# Patient Record
Sex: Female | Born: 2005 | Race: White | Hispanic: No | Marital: Single | Smoking: Former smoker
Health system: Southern US, Community
[De-identification: ages and names within clinical notes are randomized; demographics above are authoritative.]

## PROBLEM LIST (undated history)

## (undated) DIAGNOSIS — Z87440 Personal history of urinary (tract) infections: Secondary | ICD-10-CM

## (undated) DIAGNOSIS — F329 Major depressive disorder, single episode, unspecified: Secondary | ICD-10-CM

## (undated) DIAGNOSIS — K5909 Other constipation: Secondary | ICD-10-CM

## (undated) DIAGNOSIS — F419 Anxiety disorder, unspecified: Secondary | ICD-10-CM

## (undated) DIAGNOSIS — F32A Depression, unspecified: Secondary | ICD-10-CM

## (undated) DIAGNOSIS — R109 Unspecified abdominal pain: Secondary | ICD-10-CM

## (undated) HISTORY — PX: NO PAST SURGERIES: SHX2092

## (undated) HISTORY — DX: Unspecified abdominal pain: R10.9

## (undated) HISTORY — DX: Personal history of urinary (tract) infections: Z87.440

## (undated) HISTORY — DX: Other constipation: K59.09

---
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
## 2023-01-29 ENCOUNTER — Other Ambulatory Visit (HOSPITAL_BASED_OUTPATIENT_CLINIC_OR_DEPARTMENT_OTHER): Payer: Self-pay

## 2023-01-29 ENCOUNTER — Encounter (HOSPITAL_BASED_OUTPATIENT_CLINIC_OR_DEPARTMENT_OTHER): Payer: Self-pay

## 2023-01-29 ENCOUNTER — Other Ambulatory Visit: Payer: Self-pay

## 2023-01-29 ENCOUNTER — Emergency Department (HOSPITAL_BASED_OUTPATIENT_CLINIC_OR_DEPARTMENT_OTHER)
Admission: EM | Admit: 2023-01-29 | Discharge: 2023-01-29 | Disposition: A | Discharge status: Home / Self Care | Payer: Medicaid Other | Attending: Emergency Medicine | Admitting: Emergency Medicine

## 2023-01-29 DIAGNOSIS — T485X5A Adverse effect of other anti-common-cold drugs, initial encounter: Secondary | ICD-10-CM | POA: Insufficient documentation

## 2023-01-29 DIAGNOSIS — R0981 Nasal congestion: Secondary | ICD-10-CM | POA: Diagnosis present

## 2023-01-29 DIAGNOSIS — J3489 Other specified disorders of nose and nasal sinuses: Secondary | ICD-10-CM | POA: Diagnosis not present

## 2023-01-29 NOTE — ED Triage Notes (Signed)
In for eval of nasal congestion onset couple of months ago. Used Afrin. PCP administered steroid shot and antibiotic shot approx 3 weeks ago. Then prescribed prednisone PO by PCP 2 weeks ago. No relief. Reports Left eye itching. Denies cough, shortness of breath.

## 2023-01-29 NOTE — Discharge Instructions (Addendum)
As we discussed, I strongly suspect that your symptoms are due to long-term routine Afrin use.  Please discontinue this medication completely.  Please be aware, it can take some time for symptoms to resolve after discontinuing the medication.  I recommend continuing with daily Flonase use.  You can also add Zyrtec which you can get over-the-counter for additional benefit.  I have also given you a referral to an ear nose and throat doctor for you to follow-up with.  Please call them and schedule an appointment.  Please be sure that you have stopped the Afrin for several days before you have a follow-up appointment.  Return if development of any new or worsening symptoms.

## 2023-01-29 NOTE — ED Provider Notes (Signed)
[facility] Provider Note   CSN: [id number] Arrival date & time: 01/29/23  1129     History  Chief Complaint  Patient presents with   Nasal Congestion    Tiffany Hansen is a 17 y.o. female.  Patient with noncontributory past medical history presents today with complaints of nasal congestion. She states that same began about 2 months ago and has been persistent since. She has been using Afrin spray daily as well as Flonase and saline nasal mist. She went to her doctor and has been given prednisone and antibiotics which she has taken without any improvement. She has also been referred to ENT but doesn't have an appointment until March 27. She is unable to breathe out of either nare. Denies sinus pressure, cough, fevers, or chills. Her nasal drainage is clear. Symptoms have been unchanged since onset.  The history is provided by the patient. No language interpreter was used.       Home Medications Prior to Admission medications   Medication Sig Start Date End Date Taking? Authorizing Provider  norelgestromin-ethinyl estradiol Marilu Favre) 150-35 MCG/24HR transdermal patch Place 1 patch onto the skin once a week. 08/11/22   Starr Lake, CNM  acetaminophen (TYLENOL) 325 MG tablet Take 2 tablets (650 mg total) by mouth every 4 (four) hours as needed (for pain scale < 4). Patient not taking: Reported on 08/02/2022 12/14/21   Precious Gilding, DO  ibuprofen (ADVIL) 600 MG tablet Take 1 tablet (600 mg total) by mouth every 6 (six) hours. Patient not taking: Reported on 08/02/2022 12/14/21   Precious Gilding, DO      Allergies    Amoxicillin    Review of Systems   Review of Systems  HENT:  Positive for congestion.   All other systems reviewed and are negative.   Physical Exam Updated Vital Signs BP 117/69 (BP Location: Left Arm)   Pulse 93   Temp 97.9 F (36.6 C) (Oral)   Resp 20   Ht '5\' 2"'$  (1.575 m)   Wt 60.8 kg   SpO2 100%   BMI 24.51  kg/m  Physical Exam Vitals and nursing note reviewed.  Constitutional:      General: She is not in acute distress.    Appearance: Normal appearance. She is normal weight. She is not ill-appearing, toxic-appearing or diaphoretic.  HENT:     Head: Normocephalic and atraumatic.     Comments: No frontal or maxillary sinus tenderness to palpation    Nose: Congestion and rhinorrhea present.     Comments: Bilateral nares with congestion and clear nasal drainage.     Mouth/Throat:     Mouth: Mucous membranes are moist.     Pharynx: Oropharynx is clear. No oropharyngeal exudate or posterior oropharyngeal erythema.  Eyes:     Extraocular Movements: Extraocular movements intact.     Conjunctiva/sclera: Conjunctivae normal.     Pupils: Pupils are equal, round, and reactive to light.  Cardiovascular:     Rate and Rhythm: Normal rate.  Pulmonary:     Effort: Pulmonary effort is normal. No respiratory distress.  Musculoskeletal:        General: Normal range of motion.     Cervical back: Normal range of motion and neck supple.  Skin:    General: Skin is warm and dry.  Neurological:     General: No focal deficit present.     Mental Status: She is alert.  Psychiatric:        Mood  and Affect: Mood normal.        Behavior: Behavior normal.     ED Results / Procedures / Treatments   Labs (all labs ordered are listed, but only abnormal results are displayed) Labs Reviewed - No data to display  EKG None  Radiology No results found.  Procedures Procedures    Medications Ordered in ED Medications - No data to display  ED Course/ Medical Decision Making/ A&P                             Medical Decision Making  Patient presents today with complaints of 2 months of nasal congestion.  She is afebrile, nontoxic-appearing, and in no acute distress with reassuring vital signs.  Physical exam reveals nasal congestion with clear rhinorrhea.  No cough, sore throat, fevers, chills, shortness  of breath, or chest pain.  No maxillary or frontal sinus tenderness to palpation.  Symptoms not consistent with a sinus infection, she has already tried steroids and antibiotics without improvement.  Symptoms seem consistent with rebound congestion from daily Afrin use.  She does state that she has used Afrin spray almost every day for the last 2 months. Recommend discontinuing this medication and continuing daily Flonase use and adding Zyrtec as well. Will give referral to ENT per patients request. Evaluation and diagnostic testing in the emergency department does not suggest an emergent condition requiring admission or immediate intervention beyond what has been performed at this time.  Plan for discharge with close PCP follow-up.  Patient is understanding and amenable with plan, educated on red flag symptoms that would prompt immediate return.  Patient discharged in stable condition.   Final Clinical Impression(s) / ED Diagnoses Final diagnoses:  Nasal congestion due to prolonged use of decongestants    Rx / DC Orders ED Discharge Orders     None     An After Visit Summary was printed and given to the patient.     Nestor Lewandowsky 01/29/23 1231    Pattricia Boss, MD 01/30/23 [phone]

## 2023-02-18 ENCOUNTER — Other Ambulatory Visit: Payer: Self-pay | Admitting: *Deleted

## 2023-02-18 DIAGNOSIS — Z789 Other specified health status: Secondary | ICD-10-CM

## 2023-02-19 ENCOUNTER — Encounter: Payer: Self-pay | Admitting: Obstetrics & Gynecology

## 2023-02-19 ENCOUNTER — Telehealth: Payer: Self-pay

## 2023-02-19 LAB — BETA HCG QUANT (REF LAB): hCG Quant: 190 m[IU]/mL

## 2023-02-19 NOTE — Telephone Encounter (Signed)
Patient would like for someone to call her about her test results 

## 2023-02-19 NOTE — Telephone Encounter (Signed)
Spoke to patient via mychart.

## 2023-02-20 ENCOUNTER — Other Ambulatory Visit: Payer: Self-pay | Admitting: Obstetrics & Gynecology

## 2023-02-20 DIAGNOSIS — O3680X0 Pregnancy with inconclusive fetal viability, not applicable or unspecified: Secondary | ICD-10-CM

## 2023-02-20 NOTE — Progress Notes (Signed)
US order

## 2023-02-21 ENCOUNTER — Encounter: Payer: Self-pay | Admitting: Obstetrics & Gynecology

## 2023-02-25 ENCOUNTER — Encounter: Payer: Self-pay | Admitting: Obstetrics & Gynecology

## 2023-02-27 ENCOUNTER — Encounter: Payer: Self-pay | Admitting: Obstetrics & Gynecology

## 2023-02-28 ENCOUNTER — Encounter: Payer: Self-pay | Admitting: Obstetrics & Gynecology

## 2023-03-01 ENCOUNTER — Inpatient Hospital Stay (HOSPITAL_COMMUNITY)
Admission: AD | Admit: 2023-03-01 | Discharge: 2023-03-01 | Disposition: A | Discharge status: Home / Self Care | Payer: Medicaid Other | Attending: Obstetrics and Gynecology | Admitting: Obstetrics and Gynecology

## 2023-03-01 ENCOUNTER — Inpatient Hospital Stay (HOSPITAL_COMMUNITY): Payer: Medicaid Other

## 2023-03-01 ENCOUNTER — Encounter (HOSPITAL_COMMUNITY): Payer: Self-pay

## 2023-03-01 DIAGNOSIS — Z3490 Encounter for supervision of normal pregnancy, unspecified, unspecified trimester: Secondary | ICD-10-CM

## 2023-03-01 DIAGNOSIS — R1084 Generalized abdominal pain: Secondary | ICD-10-CM | POA: Diagnosis present

## 2023-03-01 DIAGNOSIS — R9431 Abnormal electrocardiogram [ECG] [EKG]: Secondary | ICD-10-CM | POA: Insufficient documentation

## 2023-03-01 DIAGNOSIS — R079 Chest pain, unspecified: Secondary | ICD-10-CM

## 2023-03-01 DIAGNOSIS — R0602 Shortness of breath: Secondary | ICD-10-CM | POA: Diagnosis not present

## 2023-03-01 DIAGNOSIS — O99891 Other specified diseases and conditions complicating pregnancy: Secondary | ICD-10-CM | POA: Diagnosis not present

## 2023-03-01 DIAGNOSIS — Z87891 Personal history of nicotine dependence: Secondary | ICD-10-CM | POA: Insufficient documentation

## 2023-03-01 DIAGNOSIS — Z3A01 Less than 8 weeks gestation of pregnancy: Secondary | ICD-10-CM | POA: Diagnosis not present

## 2023-03-01 DIAGNOSIS — Z3A00 Weeks of gestation of pregnancy not specified: Secondary | ICD-10-CM | POA: Diagnosis not present

## 2023-03-01 DIAGNOSIS — O26891 Other specified pregnancy related conditions, first trimester: Secondary | ICD-10-CM | POA: Diagnosis not present

## 2023-03-01 DIAGNOSIS — F419 Anxiety disorder, unspecified: Secondary | ICD-10-CM

## 2023-03-01 DIAGNOSIS — R0789 Other chest pain: Secondary | ICD-10-CM | POA: Insufficient documentation

## 2023-03-01 LAB — URINALYSIS, ROUTINE W REFLEX MICROSCOPIC
Bilirubin Urine: NEGATIVE
Glucose, UA: NEGATIVE mg/dL
Hgb urine dipstick: NEGATIVE
Ketones, ur: 20 mg/dL — AB
Leukocytes,Ua: NEGATIVE
Nitrite: NEGATIVE
Protein, ur: NEGATIVE mg/dL
Specific Gravity, Urine: 1.024 (ref 1.005–1.030)
pH: 6 (ref 5.0–8.0)

## 2023-03-01 LAB — CBC
HCT: 37.5 % (ref 36.0–46.0)
Hemoglobin: 12.6 g/dL (ref 12.0–15.0)
MCH: 28.5 pg (ref 26.0–34.0)
MCHC: 33.6 g/dL (ref 30.0–36.0)
MCV: 84.8 fL (ref 80.0–100.0)
Platelets: 287 10*3/uL (ref 150–400)
RBC: 4.42 MIL/uL (ref 3.87–5.11)
RDW: 13.2 % (ref 11.5–15.5)
WBC: 6.3 10*3/uL (ref 4.0–10.5)
nRBC: 0 % (ref 0.0–0.2)

## 2023-03-01 LAB — WET PREP, GENITAL
Clue Cells Wet Prep HPF POC: NONE SEEN
Trich, Wet Prep: NONE SEEN
WBC, Wet Prep HPF POC: <10 (ref <10)
Yeast Wet Prep HPF POC: NONE SEEN

## 2023-03-01 LAB — HCG, QUANTITATIVE, PREGNANCY: hCG, Beta Chain, Quant, S: 10711 m[IU]/mL — ABNORMAL HIGH (ref <5)

## 2023-03-01 NOTE — MAU Note (Signed)
...  Tiffany Hansen is a 17 y.o. at Unknown here in MAU reporting: Chest pain, SOB, and abdominal pain. She reports she was cleaning her home and began to feel abdominal cramps and felt like her chest was getting tight. She reports her abdominal pain is "all throughout." She reports she wants to ensure everything was okay. She also reports at times it feels hard to breathe. She reports the shortness of breath comes randomly. Denies VB or concerns for STD's.   [name], CNM, in triage. EKG performed by NT.   LMP: 01/21/2023 Pain score: Denies current pain.  Lab orders placed from triage:  UA

## 2023-03-01 NOTE — MAU Note (Signed)
Called Main Lab requesting blood work to be ran as it was sent an hour ago. Kim in [employer] stated she has the samples now and will run them.

## 2023-03-01 NOTE — MAU Note (Signed)
Called lab to request urine to be ran.

## 2023-03-01 NOTE — Discharge Instructions (Signed)

## 2023-03-01 NOTE — MAU Provider Note (Signed)
History     CSN: [id number]  Arrival date and time: 03/01/23 1522   Event Date/Time   First Provider Initiated Contact with Patient 03/01/23 1710      Chief Complaint  Patient presents with   Abdominal Pain   Shortness of Breath   Chest Pain   HPI Tiffany Hansen is a 17 y.o. [id number] at [redacted]w[redacted]d by LMP who presents to MAU with chief complaint of generalized abdominal pain. This is a new complaint, onset today while cleaning her house. Pain is crampy and not present on arrival to MAU or during encounter.  Patient also reports shortness of breath and chest pain.  These are recurrent problems, current episodes occurred in conjunction with onset of abdominal pain. Patient endorses history of evaluation by Cardiology and states they were never able to find a cause or definitive diagnosis. She denies activity intolerance, weakness and syncope. Pain waxes and wanes without any associated temporal a factors. Patient states there are no consistent triggers.   Patient also reports hx of anxiety. She does not feel that her SOB and chest pain are related to her anxiety.  OB History     Gravida  3   Para  1   Term  0   Preterm  1   AB  1   Living  1      SAB  1   IAB  0   Ectopic  0   Multiple  0   Live Births  1           Past Medical History:  Diagnosis Date   Abdominal pain, recurrent    Anxiety    Constipation, chronic    Depression     Past Surgical History:  Procedure Laterality Date   NO PAST SURGERIES      Family History  Problem Relation Age of Onset   Celiac disease Neg Hx    Ulcers Neg Hx    Cholelithiasis Neg Hx     Social History   Tobacco Use   Smoking status: Former    Types: Cigarettes    Quit date: 03/2021    Years since quitting: 1.9   Smokeless tobacco: Never  Vaping Use   Vaping Use: Former   Substances: Nicotine  Substance Use Topics   Alcohol use: No   Drug use: No    Allergies:  Allergies  Allergen Reactions   Amoxicillin  Itching    Medications Prior to Admission  Medication Sig Dispense Refill Last Dose   norelgestromin-ethinyl estradiol Burr Medico) 150-35 MCG/24HR transdermal patch Place 1 patch onto the skin once a week. 3 patch 12    acetaminophen (TYLENOL) 325 MG tablet Take 2 tablets (650 mg total) by mouth every 4 (four) hours as needed (for pain scale < 4). (Patient not taking: Reported on 08/02/2022)      ibuprofen (ADVIL) 600 MG tablet Take 1 tablet (600 mg total) by mouth every 6 (six) hours. (Patient not taking: Reported on 08/02/2022) 30 tablet 0     Review of Systems  Respiratory:  Positive for chest tightness and shortness of breath.   Gastrointestinal:  Positive for abdominal pain.  All other systems reviewed and are negative.  Physical Exam   Blood pressure (!) 125/49, pulse 98, temperature 98.9 F (37.2 C), temperature source Oral, resp. rate 20, height  (1.575 m), weight 63.8 kg, last menstrual period 01/21/2023, SpO2 100 %, not currently breastfeeding.  Physical Exam Vitals and nursing note reviewed. Exam conducted with  a chaperone present.  Constitutional:      Appearance: She is well-developed.  Cardiovascular:     Rate and Rhythm: Normal rate and regular rhythm.     Heart sounds: Normal heart sounds.  Pulmonary:     Effort: Pulmonary effort is normal.     Breath sounds: Normal breath sounds.  Abdominal:     General: Abdomen is flat.     Tenderness: There is no abdominal tenderness.  Skin:    Capillary Refill: Capillary refill takes less than 2 seconds.  Neurological:     Mental Status: She is alert and oriented to person, place, and time.  Psychiatric:        Mood and Affect: Mood normal.        Behavior: Behavior normal.   Patient speaking in full sentences with dyspnea. No overt signs of air hunger  MAU Course  Procedures  MDM  Orders Placed This Encounter  Procedures   Wet prep, genital   US OB LESS THAN 14 WEEKS WITH OB TRANSVAGINAL   Urinalysis, Routine w  reflex microscopic -Urine, Clean Catch   CBC   hCG, quantitative, pregnancy   EKG 12-Lead   ED EKG   Discharge patient   Patient Vitals for the past 24 hrs:  BP Temp Temp src Pulse Resp SpO2 Height Weight  03/01/23 1809 (!) 125/49 -- -- 98 -- -- -- --  03/01/23 1614 (!) 112/53 98.9 F (37.2 C) Oral 87 20 100 % -- --  03/01/23 1612 -- -- -- -- -- --  (1.575 m) 63.8 kg  ' Results for orders placed or performed during the hospital encounter of 03/01/23 (from the past 24 hour(s))  CBC     Status: None   Collection Time: 03/01/23  4:30 PM  Result Value Ref Range   WBC 6.3 4.0 - 10.5 K/uL   RBC 4.42 3.87 - 5.11 MIL/uL   Hemoglobin 12.6 12.0 - 15.0 g/dL   HCT 65.7 84.6 - 96.2 %   MCV 84.8 80.0 - 100.0 fL   MCH 28.5 26.0 - 34.0 pg   MCHC 33.6 30.0 - 36.0 g/dL   RDW 95.2 84.1 - 32.4 %   Platelets 287 150 - 400 K/uL   nRBC 0.0 0.0 - 0.2 %  Wet prep, genital     Status: None   Collection Time: 03/01/23  4:42 PM  Result Value Ref Range   Yeast Wet Prep HPF POC NONE SEEN NONE SEEN   Trich, Wet Prep NONE SEEN NONE SEEN   Clue Cells Wet Prep HPF POC NONE SEEN NONE SEEN   WBC, Wet Prep HPF POC <10 <10   Sperm PRESENT    US OB LESS THAN 14 WEEKS WITH OB TRANSVAGINAL  Result Date: 03/01/2023 CLINICAL DATA:  Abdominal pain in pregnancy EXAM: OBSTETRIC <14 WK ULTRASOUND TECHNIQUE: Transabdominal ultrasound was performed for evaluation of the gestation as well as the maternal uterus and adnexal regions. COMPARISON:  None Available. FINDINGS: Intrauterine gestational sac: Single Yolk sac:  Visualized. Embryo:  Not Visualized. Cardiac Activity: Not Visualized. Heart Rate: NA MSD:  8.4 mm   5 w   4 d CRL: N/A Subchorionic hemorrhage:  None visualized. Maternal uterus/adnexae: Trace pelvic free fluid.  Normal ovaries. IMPRESSION: Probable early intrauterine gestational sac and yolk sac, but no fetal pole or cardiac activity yet visualized. Recommend follow-up quantitative B-HCG levels and  follow-up US in 14 days to assess viability. This recommendation follows SRU consensus guidelines: Diagnostic Criteria for  Nonviable Pregnancy Early in the First Trimester. Malva Limes Med 2013; 268:3419-62. Electronically Signed   By: Agustin Cree M.D.   On: 03/01/2023 17:01   Assessment and Plan  --17 y.o. [id number] with confirmed IUP (+GS, +YS) --Grossly normal EKG reviewed with Dr. Berton Lan --No acute findings on exam or labs collected in MAU --Discharge home in stable condition with first trimester precautions  F/U: --Patient has scheduled prenatal care with [facility]  Calvert Cantor, MSA, MSN, CNM 03/01/2023, 7:33 PM

## 2023-03-05 LAB — GC/CHLAMYDIA PROBE AMP (~~LOC~~) NOT AT ARMC
Chlamydia: NEGATIVE
Comment: NEGATIVE
Comment: NORMAL
Neisseria Gonorrhea: NEGATIVE

## 2023-03-06 ENCOUNTER — Encounter: Payer: Self-pay | Admitting: Adult Health

## 2023-03-06 ENCOUNTER — Ambulatory Visit (INDEPENDENT_AMBULATORY_CARE_PROVIDER_SITE_OTHER): Payer: Medicaid Other | Admitting: Adult Health

## 2023-03-06 VITALS — Ht 62.0 in | Wt 145.0 lb

## 2023-03-06 DIAGNOSIS — F419 Anxiety disorder, unspecified: Secondary | ICD-10-CM | POA: Diagnosis not present

## 2023-03-06 DIAGNOSIS — Z3A01 Less than 8 weeks gestation of pregnancy: Secondary | ICD-10-CM

## 2023-03-06 DIAGNOSIS — F418 Other specified anxiety disorders: Secondary | ICD-10-CM | POA: Insufficient documentation

## 2023-03-06 DIAGNOSIS — O99341 Other mental disorders complicating pregnancy, first trimester: Secondary | ICD-10-CM | POA: Diagnosis not present

## 2023-03-06 MED ORDER — SERTRALINE HCL 25 MG PO TABS: 25.0000 mg | ORAL_TABLET | Freq: Every day | ORAL | 3 refills | Status: DC

## 2023-03-06 MED ORDER — HYDROXYZINE HCL 10 MG PO TABS: 10.0000 mg | ORAL_TABLET | Freq: Three times a day (TID) | ORAL | 1 refills | Status: DC | PRN

## 2023-03-06 NOTE — Progress Notes (Signed)
Patient ID: Tiffany Hansen, female   DOB: 03-Aug-2006, 17 y.o.   MRN: [id number]   TELEHEALTH GYNECOLOGY VISIT ENCOUNTER NOTE  Provider location: [facility]   Patient location: Home  I connected with Lauralye Creely on 03/06/23 at 11:50 AM EDT by telephone and verified that I am speaking with the correct person using two identifiers. Patient was unable to do MyChart audiovisual encounter due to technical difficulties, she tried several times.    I discussed the limitations, risks, security and privacy concerns of performing an evaluation and management service by telephone and the availability of in person appointments. I also discussed with the patient that there may be a patient responsible charge related to this service. The patient expressed understanding and agreed to proceed.   History:  Tiffany Hansen is a 17 y.o. [phone] female being evaluated today for anxiety. She denies any other concerns.      She says she worries and does not want to be alone, has had anxiety in the past, but does not remember medication she took. She is about [redacted] weeks pregnant by LMP.  Past Medical History:  Diagnosis Date   Abdominal pain, recurrent    Anxiety    Constipation, chronic    Depression    Past Surgical History:  Procedure Laterality Date   NO PAST SURGERIES     The following portions of the patient's history were reviewed and updated as appropriate: allergies, current medications, past family history, past medical history, past social history, past surgical history and problem list.   Health Maintenance:  pap at 19  Review of Systems:  Pertinent items noted in HPI and remainder of comprehensive ROS otherwise negative.  Physical Exam:   General:  Alert, oriented and cooperative.   Mental Status: Normal mood and affect perceived. Normal judgment and thought content.  Physical exam deferred due to nature of the encounter Ht  (1.575 m)   Wt 145 lb (65.8 kg)    LMP 01/21/2023   Breastfeeding No   BMI 26.52 kg/m   Labs and Imaging Results for orders placed or performed during the hospital encounter of 03/01/23 (from the past 336 hour(s))  GC/Chlamydia probe amp ([place])not at [facility]   Collection Time: 03/01/23  4:20 PM  Result Value Ref Range   Neisseria Gonorrhea Negative    Chlamydia Negative    Comment Normal Reference Ranger Chlamydia - Negative    Comment      Normal Reference Range Neisseria Gonorrhea - Negative  CBC   Collection Time: 03/01/23  4:30 PM  Result Value Ref Range   WBC 6.3 4.0 - 10.5 K/uL   RBC 4.42 3.87 - 5.11 MIL/uL   Hemoglobin 12.6 12.0 - 15.0 g/dL   HCT 14.7 82.9 - 56.2 %   MCV 84.8 80.0 - 100.0 fL   MCH 28.5 26.0 - 34.0 pg   MCHC 33.6 30.0 - 36.0 g/dL   RDW 13.0 86.5 - 78.4 %   Platelets 287 150 - 400 K/uL   nRBC 0.0 0.0 - 0.2 %  hCG, quantitative, pregnancy   Collection Time: 03/01/23  4:30 PM  Result Value Ref Range   hCG, Beta Chain, Quant, S 10,711 (H) <5 mIU/mL  Wet prep, genital   Collection Time: 03/01/23  4:42 PM  Result Value Ref Range   Yeast Wet Prep HPF POC NONE SEEN NONE SEEN   Trich, Wet Prep NONE SEEN NONE SEEN   Clue Cells Wet Prep HPF  POC NONE SEEN NONE SEEN   WBC, Wet Prep HPF POC <10 <10   Sperm PRESENT   Urinalysis, Routine w reflex microscopic -Urine, Clean Catch   Collection Time: 03/01/23  6:12 PM  Result Value Ref Range   Color, Urine YELLOW YELLOW   APPearance CLEAR CLEAR   Specific Gravity, Urine 1.024 1.005 - 1.030   pH 6.0 5.0 - 8.0   Glucose, UA NEGATIVE NEGATIVE mg/dL   Hgb urine dipstick NEGATIVE NEGATIVE   Bilirubin Urine NEGATIVE NEGATIVE   Ketones, ur 20 (A) NEGATIVE mg/dL   Protein, ur NEGATIVE NEGATIVE mg/dL   Nitrite NEGATIVE NEGATIVE   Leukocytes,Ua NEGATIVE NEGATIVE   US OB LESS THAN 14 WEEKS WITH OB TRANSVAGINAL  Result Date: 03/01/2023 CLINICAL DATA:  Abdominal pain in pregnancy EXAM: OBSTETRIC <14 WK ULTRASOUND TECHNIQUE: Transabdominal ultrasound  was performed for evaluation of the gestation as well as the maternal uterus and adnexal regions. COMPARISON:  None Available. FINDINGS: Intrauterine gestational sac: Single Yolk sac:  Visualized. Embryo:  Not Visualized. Cardiac Activity: Not Visualized. Heart Rate: NA MSD:  8.4 mm   5 w   4 d CRL: N/A Subchorionic hemorrhage:  None visualized. Maternal uterus/adnexae: Trace pelvic free fluid.  Normal ovaries. IMPRESSION: Probable early intrauterine gestational sac and yolk sac, but no fetal pole or cardiac activity yet visualized. Recommend follow-up quantitative B-HCG levels and follow-up US in 14 days to assess viability. This recommendation follows SRU consensus guidelines: Diagnostic Criteria for Nonviable Pregnancy Early in the First Trimester. Malva Limes Med 2013; 161:0960-45. Electronically Signed   By: Agustin Cree M.D.   On: 03/01/2023 17:01      Assessment and Plan:     1. Anxiety +anxiety and worries Will rx Zoloft 25 mg 1 daily and vistaril 10 mg 1 every 8 hours prn anxiety Meds ordered this encounter  Medications   sertraline (ZOLOFT) 25 MG tablet    Sig: Take 1 tablet (25 mg total) by mouth daily.    Dispense:  30 tablet    Refill:  3    Order Specific Question:   Supervising Provider    Answer:   Duane Lope H [[id number]]   hydrOXYzine (ATARAX) 10 MG tablet    Sig: Take 1 tablet (10 mg total) by mouth every 8 (eight) hours as needed. For anxiety    Dispense:  30 tablet    Refill:  1    Order Specific Question:   Supervising Provider    Answer:   Lazaro Arms [[id number]]   Discussed with Dr Charlotta Newton  Call if any questions or concerns   2. Less than [redacted] weeks gestation of pregnancy  Has [place] appt 03/19/23 in follow up      I discussed the assessment and treatment plan with the patient. The patient was provided an opportunity to ask questions and all were answered. The patient agreed with the plan and demonstrated an understanding of the instructions.   The patient was advised to call back or  seek an in-person evaluation/go to the ED if the symptoms worsen or if the condition fails to improve as anticipated.  I provided 15  minutes of non-face-to-face time during this encounter.   Cyril Mourning, NP [facility] [employer], [facility]

## 2023-03-14 ENCOUNTER — Other Ambulatory Visit (INDEPENDENT_AMBULATORY_CARE_PROVIDER_SITE_OTHER): Payer: Medicaid Other

## 2023-03-14 DIAGNOSIS — R350 Frequency of micturition: Secondary | ICD-10-CM

## 2023-03-14 LAB — POCT URINALYSIS DIPSTICK OB
Blood, UA: NEGATIVE
Glucose, UA: NEGATIVE
Ketones, UA: NEGATIVE
Leukocytes, UA: NEGATIVE
Nitrite, UA: NEGATIVE

## 2023-03-14 NOTE — Progress Notes (Addendum)
   NURSE VISIT- UTI SYMPTOMS   SUBJECTIVE:  Tiffany Hansen is a 17 y.o. [phone] female here for UTI symptoms. She is [redacted]w[redacted]d pregnant. She reports urinary frequency. She has been to Urgent Care twice for the same issue and has been on two different antibiotics with no relief.  Has a history of UTI with last pregnancy.  OBJECTIVE:  LMP 01/21/2023   Appears well, in no apparent distress  Results for orders placed or performed in visit on 03/14/23 (from the past 24 hour(s))  POC Urinalysis Dipstick OB   Collection Time: 03/14/23  3:59 PM  Result Value Ref Range   Color, UA     Clarity, UA     Glucose, UA Negative Negative   Bilirubin, UA     Ketones, UA neg    Spec Grav, UA     Blood, UA neg    pH, UA     POC,PROTEIN,UA Trace Negative, Trace, Small (1+), Moderate (2+), Large (3+), 4+   Urobilinogen, UA     Nitrite, UA neg    Leukocytes, UA Negative Negative   Appearance     Odor      ASSESSMENT: Pregnancy [redacted]w[redacted]d with UTI symptoms and negative nitrites  PLAN: Discussed with Cathie Beams, CNM   Rx sent by provider today: No Urine culture sent Call or return to clinic prn if these symptoms worsen or fail to improve as anticipated. Follow-up: as scheduled   Jobe Marker  03/14/2023 4:00 PM   Discussed w/Dr. Despina Hidden, agrees w/plan.  Could be IC or pregnancy related urgency/frequency.  Too early to give meds for either, unfortunately.

## 2023-03-19 ENCOUNTER — Ambulatory Visit (INDEPENDENT_AMBULATORY_CARE_PROVIDER_SITE_OTHER): Payer: Medicaid Other

## 2023-03-19 DIAGNOSIS — Z3481 Encounter for supervision of other normal pregnancy, first trimester: Secondary | ICD-10-CM | POA: Diagnosis not present

## 2023-03-19 DIAGNOSIS — O3680X0 Pregnancy with inconclusive fetal viability, not applicable or unspecified: Secondary | ICD-10-CM

## 2023-03-19 DIAGNOSIS — Z3A08 8 weeks gestation of pregnancy: Secondary | ICD-10-CM

## 2023-03-19 NOTE — Progress Notes (Signed)
[place] 8+1 wks,single IUP with yolk sac,FHR 166 bpm,normal ovaries,CRL 16.78 mm

## 2023-03-21 LAB — URINE CULTURE

## 2023-03-22 ENCOUNTER — Encounter: Payer: Self-pay | Admitting: Obstetrics & Gynecology

## 2023-03-27 ENCOUNTER — Encounter: Payer: Self-pay | Admitting: Obstetrics & Gynecology

## 2023-04-19 ENCOUNTER — Encounter: Payer: Self-pay | Admitting: Women's Health

## 2023-04-19 ENCOUNTER — Other Ambulatory Visit: Payer: Self-pay | Admitting: Obstetrics & Gynecology

## 2023-04-19 DIAGNOSIS — O3680X0 Pregnancy with inconclusive fetal viability, not applicable or unspecified: Secondary | ICD-10-CM

## 2023-04-19 DIAGNOSIS — Z8759 Personal history of other complications of pregnancy, childbirth and the puerperium: Secondary | ICD-10-CM | POA: Insufficient documentation

## 2023-04-19 DIAGNOSIS — Z3490 Encounter for supervision of normal pregnancy, unspecified, unspecified trimester: Secondary | ICD-10-CM | POA: Insufficient documentation

## 2023-04-22 ENCOUNTER — Encounter: Payer: Self-pay | Admitting: Women's Health

## 2023-04-22 ENCOUNTER — Ambulatory Visit (INDEPENDENT_AMBULATORY_CARE_PROVIDER_SITE_OTHER): Payer: Medicaid Other

## 2023-04-22 ENCOUNTER — Ambulatory Visit (INDEPENDENT_AMBULATORY_CARE_PROVIDER_SITE_OTHER): Payer: Medicaid Other | Admitting: Women's Health

## 2023-04-22 ENCOUNTER — Encounter: Payer: Medicaid Other | Admitting: *Deleted

## 2023-04-22 VITALS — BP 118/64 | HR 90 | Wt 133.0 lb

## 2023-04-22 DIAGNOSIS — Z3480 Encounter for supervision of other normal pregnancy, unspecified trimester: Secondary | ICD-10-CM

## 2023-04-22 DIAGNOSIS — Z3A13 13 weeks gestation of pregnancy: Secondary | ICD-10-CM | POA: Diagnosis not present

## 2023-04-22 DIAGNOSIS — O3680X0 Pregnancy with inconclusive fetal viability, not applicable or unspecified: Secondary | ICD-10-CM

## 2023-04-22 DIAGNOSIS — Z8751 Personal history of pre-term labor: Secondary | ICD-10-CM | POA: Diagnosis not present

## 2023-04-22 DIAGNOSIS — Z8759 Personal history of other complications of pregnancy, childbirth and the puerperium: Secondary | ICD-10-CM

## 2023-04-22 DIAGNOSIS — Z3481 Encounter for supervision of other normal pregnancy, first trimester: Secondary | ICD-10-CM | POA: Diagnosis not present

## 2023-04-22 DIAGNOSIS — Z87440 Personal history of urinary (tract) infections: Secondary | ICD-10-CM

## 2023-04-22 DIAGNOSIS — F418 Other specified anxiety disorders: Secondary | ICD-10-CM

## 2023-04-22 MED ORDER — BLOOD PRESSURE MONITOR MISC: 0 refills | Status: DC

## 2023-04-22 NOTE — Progress Notes (Signed)
[place] 13 wks,measurements c/w dates,CRL 71.26 mm,NB present,NT 1.5 mm,normal ovaries,FHR 145 bpm,anterior placenta

## 2023-04-22 NOTE — Patient Instructions (Signed)
Tiffany Hansen, thank you for choosing our office today! We appreciate the opportunity to meet your healthcare needs. You may receive a short survey by mail, e-mail, or through [employer]. If you are happy with your care we would appreciate if you could take just a few minutes to complete the survey questions. We read all of your comments and take your feedback very seriously. Thank you again for choosing our office.  [facility] [employer] [facility] at [facility]  [facility] ([address] [place], [place] [address]) Entrance C, located off of [address] Free 24/7 valet parking   Nausea & Vomiting Have saltine crackers or pretzels by your bed and eat a few bites before you raise your head out of bed in the morning Eat small frequent meals throughout the day instead of large meals Drink plenty of fluids throughout the day to stay hydrated, just don't drink a lot of fluids with your meals.  This can make your stomach fill up faster making you feel sick Do not brush your teeth right after you eat Products with real ginger are good for nausea, like ginger ale and ginger hard candy Make sure it says made with real ginger! Sucking on sour candy like lemon heads is also good for nausea If your prenatal vitamins make you nauseated, take them at night so you will sleep through the nausea Sea Bands If you feel like you need medicine for the nausea & vomiting please let us know If you are unable to keep any fluids or food down please let us know   Constipation Drink plenty of fluid, preferably water, throughout the day Eat foods high in fiber such as fruits, vegetables, and grains Exercise, such as walking, is a good way to keep your bowels regular Drink warm fluids, especially warm prune juice, or decaf coffee Eat a 1/2 cup of real oatmeal (not instant), 1/2 cup applesauce, and 1/2-1 cup warm prune juice every day If needed, you may take Colace (docusate sodium) stool softener  once or twice a day to help keep the stool soft.  If you still are having problems with constipation, you may take Miralax once daily as needed to help keep your bowels regular.   Home Blood Pressure Monitoring for Patients   Your provider has recommended that you check your blood pressure (BP) at least once a week at home. If you do not have a blood pressure cuff at home, one will be provided for you. Contact your provider if you have not received your monitor within 1 week.   Helpful Tips for Accurate Home Blood Pressure Checks  Don't smoke, exercise, or drink caffeine 30 minutes before checking your BP Use the restroom before checking your BP (a full bladder can raise your pressure) Relax in a comfortable upright chair Feet on the ground Left arm resting comfortably on a flat surface at the level of your heart Legs uncrossed Back supported Sit quietly and don't talk Place the cuff on your bare arm Adjust snuggly, so that only two fingertips can fit between your skin and the top of the cuff Check 2 readings separated by at least one minute Keep a log of your BP readings For a visual, please reference this diagram: http://ccnc.care/bpdiagram  Provider Name: [facility]     Phone: [phone]  Zone 1: ALL CLEAR  Continue to monitor your symptoms:  BP reading is less than 140 (top number) or less than 90 (bottom  number)  No right upper stomach pain No headaches or seeing spots No feeling nauseated or throwing up No swelling in face and hands  Zone 2: CAUTION Call your doctor's office for any of the following:  BP reading is greater than 140 (top number) or greater than 90 (bottom number)  Stomach pain under your ribs in the middle or right side Headaches or seeing spots Feeling nauseated or throwing up Swelling in face and hands  Zone 3: EMERGENCY  Seek immediate medical care if you have any of the following:  BP reading is greater than160 (top number) or greater than  110 (bottom number) Severe headaches not improving with Tylenol Serious difficulty catching your breath Any worsening symptoms from Zone 2    First Trimester of Pregnancy The first trimester of pregnancy is from week 1 until the end of week 12 (months 1 through 3). A week after a sperm fertilizes an egg, the egg will implant on the wall of the uterus. This embryo will begin to develop into a baby. Genes from you and your partner are forming the baby. The female genes determine whether the baby is a boy or a girl. At 6-8 weeks, the eyes and face are formed, and the heartbeat can be seen on ultrasound. At the end of 12 weeks, all the baby's organs are formed.  Now that you are pregnant, you will want to do everything you can to have a healthy baby. Two of the most important things are to get good prenatal care and to follow your health care provider's instructions. Prenatal care is all the medical care you receive before the baby's birth. This care will help prevent, find, and treat any problems during the pregnancy and childbirth. BODY CHANGES Your body goes through many changes during pregnancy. The changes vary from woman to woman.  You may gain or lose a couple of pounds at first. You may feel sick to your stomach (nauseous) and throw up (vomit). If the vomiting is uncontrollable, call your health care provider. You may tire easily. You may develop headaches that can be relieved by medicines approved by your health care provider. You may urinate more often. Painful urination may mean you have a bladder infection. You may develop heartburn as a result of your pregnancy. You may develop constipation because certain hormones are causing the muscles that push waste through your intestines to slow down. You may develop hemorrhoids or swollen, bulging veins (varicose veins). Your breasts may begin to grow larger and become tender. Your nipples may stick out more, and the tissue that surrounds them  (areola) may become darker. Your gums may bleed and may be sensitive to brushing and flossing. Dark spots or blotches (chloasma, mask of pregnancy) may develop on your face. This will likely fade after the baby is born. Your menstrual periods will stop. You may have a loss of appetite. You may develop cravings for certain kinds of food. You may have changes in your emotions from day to day, such as being excited to be pregnant or being concerned that something may go wrong with the pregnancy and baby. You may have more vivid and strange dreams. You may have changes in your hair. These can include thickening of your hair, rapid growth, and changes in texture. Some women also have hair loss during or after pregnancy, or hair that feels dry or thin. Your hair will most likely return to normal after your baby is born. WHAT TO EXPECT AT YOUR PRENATAL  VISITS During a routine prenatal visit: You will be weighed to make sure you and the baby are growing normally. Your blood pressure will be taken. Your abdomen will be measured to track your baby's growth. The fetal heartbeat will be listened to starting around week 10 or 12 of your pregnancy. Test results from any previous visits will be discussed. Your health care provider may ask you: How you are feeling. If you are feeling the baby move. If you have had any abnormal symptoms, such as leaking fluid, bleeding, severe headaches, or abdominal cramping. If you have any questions. Other tests that may be performed during your first trimester include: Blood tests to find your blood type and to check for the presence of any previous infections. They will also be used to check for low iron levels (anemia) and Rh antibodies. Later in the pregnancy, blood tests for diabetes will be done along with other tests if problems develop. Urine tests to check for infections, diabetes, or protein in the urine. An ultrasound to confirm the proper growth and development  of the baby. An amniocentesis to check for possible genetic problems. Fetal screens for spina bifida and Down syndrome. You may need other tests to make sure you and the baby are doing well. HOME CARE INSTRUCTIONS  Medicines Follow your health care provider's instructions regarding medicine use. Specific medicines may be either safe or unsafe to take during pregnancy. Take your prenatal vitamins as directed. If you develop constipation, try taking a stool softener if your health care provider approves. Diet Eat regular, well-balanced meals. Choose a variety of foods, such as meat or vegetable-based protein, fish, milk and low-fat dairy products, vegetables, fruits, and whole grain breads and cereals. Your health care provider will help you determine the amount of weight gain that is right for you. Avoid raw meat and uncooked cheese. These carry germs that can cause birth defects in the baby. Eating four or five small meals rather than three large meals a day may help relieve nausea and vomiting. If you start to feel nauseous, eating a few soda crackers can be helpful. Drinking liquids between meals instead of during meals also seems to help nausea and vomiting. If you develop constipation, eat more high-fiber foods, such as fresh vegetables or fruit and whole grains. Drink enough fluids to keep your urine clear or pale yellow. Activity and Exercise Exercise only as directed by your health care provider. Exercising will help you: Control your weight. Stay in shape. Be prepared for labor and delivery. Experiencing pain or cramping in the lower abdomen or low back is a good sign that you should stop exercising. Check with your health care provider before continuing normal exercises. Try to avoid standing for long periods of time. Move your legs often if you must stand in one place for a long time. Avoid heavy lifting. Wear low-heeled shoes, and practice good posture. You may continue to have sex  unless your health care provider directs you otherwise. Relief of Pain or Discomfort Wear a good support bra for breast tenderness.   Take warm sitz baths to soothe any pain or discomfort caused by hemorrhoids. Use hemorrhoid cream if your health care provider approves.   Rest with your legs elevated if you have leg cramps or low back pain. If you develop varicose veins in your legs, wear support hose. Elevate your feet for 15 minutes, 3-4 times a day. Limit salt in your diet. Prenatal Care Schedule your prenatal visits by the  twelfth week of pregnancy. They are usually scheduled monthly at first, then more often in the last 2 months before delivery. Write down your questions. Take them to your prenatal visits. Keep all your prenatal visits as directed by your health care provider. Safety Wear your seat belt at all times when driving. Make a list of emergency phone numbers, including numbers for family, friends, the hospital, and police and fire departments. General Tips Ask your health care provider for a referral to a local prenatal education class. Begin classes no later than at the beginning of month 6 of your pregnancy. Ask for help if you have counseling or nutritional needs during pregnancy. Your health care provider can offer advice or refer you to specialists for help with various needs. Do not use hot tubs, steam rooms, or saunas. Do not douche or use tampons or scented sanitary pads. Do not cross your legs for long periods of time. Avoid cat litter boxes and soil used by cats. These carry germs that can cause birth defects in the baby and possibly loss of the fetus by miscarriage or stillbirth. Avoid all smoking, herbs, alcohol, and medicines not prescribed by your health care provider. Chemicals in these affect the formation and growth of the baby. Schedule a dentist appointment. At home, brush your teeth with a soft toothbrush and be gentle when you floss. SEEK MEDICAL CARE IF:   You have dizziness. You have mild pelvic cramps, pelvic pressure, or nagging pain in the abdominal area. You have persistent nausea, vomiting, or diarrhea. You have a bad smelling vaginal discharge. You have pain with urination. You notice increased swelling in your face, hands, legs, or ankles. SEEK IMMEDIATE MEDICAL CARE IF:  You have a fever. You are leaking fluid from your vagina. You have spotting or bleeding from your vagina. You have severe abdominal cramping or pain. You have rapid weight gain or loss. You vomit blood or material that looks like coffee grounds. You are exposed to [place] measles and have never had them. You are exposed to fifth disease or chickenpox. You develop a severe headache. You have shortness of breath. You have any kind of trauma, such as from a fall or a car accident. Document Released: 10/30/2001 Document Revised: 03/22/2014 Document Reviewed: 09/15/2013 [facility] Patient Information 2015 [place], [place]. This information is not intended to replace advice given to you by your health care provider. Make sure you discuss any questions you have with your health care provider.

## 2023-04-22 NOTE — Progress Notes (Signed)
INITIAL OBSTETRICAL VISIT Patient name: Tiffany Hansen MRN [id number]  Date of birth: [birth year] Chief Complaint:   Initial Prenatal Visit  History of Present Illness:   Tiffany Hansen is a 17 y.o. G74P0111 Caucasian female at [redacted]w[redacted]d by LMP c/w u/s at 8 weeks with an Estimated Date of Delivery: 10/28/23 being seen today for her initial obstetrical visit.   Patient's last menstrual period was 01/21/2023. Her obstetrical history is significant for  SAB x 1, 35.5wk PTB d/t spontaneous labor- admitted w/ presumed pyleo (d/t back pain, h/o recurrent UTIs, WBC/low grade fever), urine cx neg and was presumed chorioamnionitis w/ intact membranes .   Today she reports  nausea when doesn't eat, declines meds . Doing well on zoloft 25mg  and atarax 10mg  prn, feels more anxious than anything.  Last pap <18yo. Results were: N/A     04/22/2023   10:10 AM 03/06/2023   12:04 PM 08/02/2022    2:31 PM 03/01/2022   11:01 AM 12/04/2021    9:29 AM  Depression screen PHQ 2/9  Decreased Interest 0 2 0 0 0  Down, Depressed, Hopeless 0 0 0 0 0  PHQ - 2 Score 0 2 0 0 0  Altered sleeping 0 0 0 0 0  Tired, decreased energy 0 1 0 3 0  Change in appetite 0 0 0 0 0  Feeling bad or failure about yourself  0 0 0 0 0  Trouble concentrating 0 1 0 0 0  Moving slowly or fidgety/restless 0 0 0 0 0  Suicidal thoughts 0 0 0 0 0  PHQ-9 Score 0 4 0 3 0        04/22/2023   10:11 AM 03/06/2023   12:06 PM 08/02/2022    2:31 PM 03/01/2022   11:02 AM  GAD 7 : Generalized Anxiety Score  Nervous, Anxious, on Edge 1 3 0 0  Control/stop worrying 0 3 0 0  Worry too much - different things 0 3 0 0  Trouble relaxing 0 1 0 0  Restless 0 0 0 0  Easily annoyed or irritable 0 0 0 0  Afraid - awful might happen 0 1 0 0  Total GAD 7 Score 1 11 0 0     Review of Systems:   Pertinent items are noted in HPI Denies cramping/contractions, leakage of fluid, vaginal bleeding, abnormal vaginal discharge w/ itching/odor/irritation, headaches, visual  changes, shortness of breath, chest pain, abdominal pain, severe nausea/vomiting, or problems with urination or bowel movements unless otherwise stated above.  Pertinent History Reviewed:  Reviewed past medical,surgical, social, obstetrical and family history.  Reviewed problem list, medications and allergies. OB History  Gravida Para Term Preterm AB Living  3 1 0 1 1 1   SAB IAB Ectopic Multiple Live Births  1 0 0 0 1    # Outcome Date GA Lbr Len/2nd Weight Sex Delivery Anes PTL Lv  3 Current           2 Preterm 12/12/21 [redacted]w[redacted]d 07:59 / 00:36 6 lb 0.3 oz (2.73 kg) M Vag-Spont EPI  LIV     Complications: Chorioamnionitis  1 SAB            Physical Assessment:   Vitals:   04/22/23 0924  BP: 118/64  Pulse: 90  Weight: 133 lb (60.3 kg)  Body mass index is 24.33 kg/m.       Physical Examination:  General appearance - well appearing, and in no distress  Mental status - alert,  oriented to person, place, and time  Psych:  She has a normal mood and affect  Skin - warm and dry, normal color, no suspicious lesions noted  Chest - effort normal, all lung fields clear to auscultation bilaterally  Heart - normal rate and regular rhythm  Abdomen - soft, nontender  Extremities:  No swelling or varicosities noted  Thin prep pap is not done   Chaperone: N/A    TODAY'S NT [place] 13 wks,measurements c/w dates,CRL 71.26 mm,NB present,NT 1.5 mm,normal ovaries,FHR 145 bpm,anterior placenta   No results found for this or any previous visit (from the past 24 hour(s)).  Assessment & Plan:  1) Low-Risk Pregnancy [id number] at [redacted]w[redacted]d with an Estimated Date of Delivery: 10/28/23   2) Initial OB visit  3) H/O 35.5wk PTB> d/t spontaneous PTL initially thought to be pyelo (had recurrent UTIs), but was d/t chorioamnionitis prior to labor  4) Dep/anx> doing well on zoloft & atarax, [facility] referral ordered  5) H/O recurrent UTIs> urine cx today  Meds:  Meds ordered this encounter  Medications   Blood Pressure  Monitor MISC    Sig: For regular home bp monitoring during pregnancy    Dispense:  1 each    Refill:  0    Z34.81 Please mail to patient    Initial labs obtained Continue prenatal vitamins Reviewed n/v relief measures and warning s/s to report Reviewed recommended weight gain based on pre-gravid BMI Encouraged well-balanced diet Genetic & carrier screening discussed: requests Panorama and NT/IT, Horizon neg prev preg Ultrasound discussed; fetal survey: requested CCNC completed> form faxed if has or is planning to apply for medicaid The nature of [place] [facility] [employer] with multiple MDs and other Advanced Practice Providers was explained to patient; also emphasized that fellows, residents, and students are part of our team. Does not have home bp cuff. Office bp cuff given: no. Rx sent: yes. Check bp weekly, let us know if consistently >140/90.   Follow-up: Return in about 3 weeks (around 05/13/2023) for LROB, 2nd IT, CNM, in person; then 6wks from now anatomy u/s and LROB w/ CNM.   Orders Placed This Encounter  Procedures   Urine Culture   GC/Chlamydia Probe Amp   Integrated 1   CBC/D/Plt+RPR+Rh+ABO+RubIgG...   PANORAMA PRENATAL TEST   Amb ref to [facility]    Cheral Marker CNM, [facility] 04/22/2023 10:18 AM

## 2023-04-23 LAB — INTEGRATED 1

## 2023-04-23 LAB — HCV INTERPRETATION

## 2023-04-24 ENCOUNTER — Encounter: Payer: Self-pay | Admitting: Obstetrics & Gynecology

## 2023-04-24 LAB — CBC/D/PLT+RPR+RH+ABO+RUBIGG...
Antibody Screen: NEGATIVE
Basophils Absolute: 0 10*3/uL (ref 0.0–0.2)
Basos: 0 %
EOS (ABSOLUTE): 0.2 10*3/uL (ref 0.0–0.4)
Eos: 3 %
HCV Ab: NONREACTIVE
HIV Screen 4th Generation wRfx: NONREACTIVE
Hematocrit: 36.6 % (ref 34.0–46.6)
Hemoglobin: 11.9 g/dL (ref 11.1–15.9)
Hepatitis B Surface Ag: NEGATIVE
Immature Grans (Abs): 0 10*3/uL (ref 0.0–0.1)
Immature Granulocytes: 0 %
Lymphocytes Absolute: 1.6 10*3/uL (ref 0.7–3.1)
Lymphs: 21 %
MCH: 27.7 pg (ref 26.6–33.0)
MCHC: 32.5 g/dL (ref 31.5–35.7)
MCV: 85 fL (ref 79–97)
Monocytes Absolute: 0.6 10*3/uL (ref 0.1–0.9)
Monocytes: 8 %
Neutrophils Absolute: 5.1 10*3/uL (ref 1.4–7.0)
Neutrophils: 68 %
Platelets: 257 10*3/uL (ref 150–450)
RBC: 4.29 x10E6/uL (ref 3.77–5.28)
RDW: 13.3 % (ref 11.7–15.4)
RPR Ser Ql: NONREACTIVE
Rh Factor: POSITIVE
Rubella Antibodies, IGG: 10.4 index (ref >0.99)
WBC: 7.6 10*3/uL (ref 3.4–10.8)

## 2023-04-24 LAB — INTEGRATED 1
Crown Rump Length: 71.3 mm
Gest. Age on Collection Date: 13.1 weeks
Maternal Age at EDD: 19.5 yr
Nuchal Translucency (NT): 1.5 mm
Number of Fetuses: 1
PAPP-A Value: 916.3 ng/mL
Weight: 133 [lb_av]

## 2023-04-24 LAB — URINE CULTURE

## 2023-04-24 LAB — GC/CHLAMYDIA PROBE AMP
Chlamydia trachomatis, NAA: NEGATIVE
Neisseria Gonorrhoeae by PCR: NEGATIVE

## 2023-04-28 ENCOUNTER — Encounter: Payer: Self-pay | Admitting: Obstetrics & Gynecology

## 2023-04-30 ENCOUNTER — Encounter: Payer: Self-pay | Admitting: Obstetrics & Gynecology

## 2023-04-30 LAB — PANORAMA PRENATAL TEST FULL PANEL:PANORAMA TEST PLUS 5 ADDITIONAL MICRODELETIONS: FETAL FRACTION: 7.2

## 2023-05-13 ENCOUNTER — Ambulatory Visit (INDEPENDENT_AMBULATORY_CARE_PROVIDER_SITE_OTHER): Payer: Medicaid Other | Admitting: Women's Health

## 2023-05-13 ENCOUNTER — Encounter: Payer: Self-pay | Admitting: Women's Health

## 2023-05-13 ENCOUNTER — Other Ambulatory Visit (HOSPITAL_COMMUNITY)
Admission: RE | Admit: 2023-05-13 | Discharge: 2023-05-13 | Disposition: A | Discharge status: Home / Self Care | Payer: Medicaid Other | Source: Ambulatory Visit | Attending: Women's Health | Admitting: Women's Health

## 2023-05-13 VITALS — BP 107/63 | HR 92 | Wt 135.4 lb

## 2023-05-13 DIAGNOSIS — N898 Other specified noninflammatory disorders of vagina: Secondary | ICD-10-CM | POA: Diagnosis present

## 2023-05-13 DIAGNOSIS — O26892 Other specified pregnancy related conditions, second trimester: Secondary | ICD-10-CM

## 2023-05-13 DIAGNOSIS — Z3482 Encounter for supervision of other normal pregnancy, second trimester: Secondary | ICD-10-CM | POA: Insufficient documentation

## 2023-05-13 DIAGNOSIS — Z363 Encounter for antenatal screening for malformations: Secondary | ICD-10-CM

## 2023-05-13 DIAGNOSIS — Z3480 Encounter for supervision of other normal pregnancy, unspecified trimester: Secondary | ICD-10-CM

## 2023-05-13 DIAGNOSIS — Z3A16 16 weeks gestation of pregnancy: Secondary | ICD-10-CM

## 2023-05-13 NOTE — Progress Notes (Signed)
LOW-RISK PREGNANCY VISIT Patient name: Tiffany Hansen MRN [id number]  Date of birth: Nov 14, 2006 Chief Complaint:   Routine Prenatal Visit (Yellow stringy discharge yesterday)  History of Present Illness:   Tiffany Hansen is a 17 y.o. G3P0111 female at [redacted]w[redacted]d with an Estimated Date of Delivery: 10/28/23 being seen today for ongoing management of a low-risk pregnancy.   Today she reports  yellow d/c yesterday, no itching/odor/irritation, would like to do swab to check it out. Locked out of MyChart, can't get back in. Deactivated acct and sent new link today . Contractions: Not present. Vag. Bleeding: None.   . denies leaking of fluid.     04/22/2023   10:10 AM 03/06/2023   12:04 PM 08/02/2022    2:31 PM 03/01/2022   11:01 AM 12/04/2021    9:29 AM  Depression screen PHQ 2/9  Decreased Interest 0 2 0 0 0  Down, Depressed, Hopeless 0 0 0 0 0  PHQ - 2 Score 0 2 0 0 0  Altered sleeping 0 0 0 0 0  Tired, decreased energy 0 1 0 3 0  Change in appetite 0 0 0 0 0  Feeling bad or failure about yourself  0 0 0 0 0  Trouble concentrating 0 1 0 0 0  Moving slowly or fidgety/restless 0 0 0 0 0  Suicidal thoughts 0 0 0 0 0  PHQ-9 Score 0 4 0 3 0        04/22/2023   10:11 AM 03/06/2023   12:06 PM 08/02/2022    2:31 PM 03/01/2022   11:02 AM  GAD 7 : Generalized Anxiety Score  Nervous, Anxious, on Edge 1 3 0 0  Control/stop worrying 0 3 0 0  Worry too much - different things 0 3 0 0  Trouble relaxing 0 1 0 0  Restless 0 0 0 0  Easily annoyed or irritable 0 0 0 0  Afraid - awful might happen 0 1 0 0  Total GAD 7 Score 1 11 0 0      Review of Systems:   Pertinent items are noted in HPI Denies abnormal vaginal discharge w/ itching/odor/irritation, headaches, visual changes, shortness of breath, chest pain, abdominal pain, severe nausea/vomiting, or problems with urination or bowel movements unless otherwise stated above. Pertinent History Reviewed:  Reviewed past medical,surgical, social, obstetrical  and family history.  Reviewed problem list, medications and allergies. Physical Assessment:   Vitals:   05/13/23 0923  BP: 107/63  Pulse: 92  Weight: 135 lb 6.4 oz (61.4 kg)  Body mass index is 24.76 kg/m.        Physical Examination:   General appearance: Well appearing, and in no distress  Mental status: Alert, oriented to person, place, and time  Skin: Warm & dry  Cardiovascular: Normal heart rate noted  Respiratory: Normal respiratory effort, no distress  Abdomen: Soft, gravid, nontender  Pelvic: Cervical exam deferred self collected CV swab        Extremities: Edema: None  Fetal Status: Fetal Heart Rate (bpm): 145        Chaperone: N/A   No results found for this or any previous visit (from the past 24 hour(s)).  Assessment & Plan:  1) Low-risk pregnancy G3P0111 at [redacted]w[redacted]d with an Estimated Date of Delivery: 10/28/23   2) Yellow d/c, self collected CV swab  3) H/O 35wk PTB   Meds: No orders of the defined types were placed in this encounter.  Labs/procedures today: CV swab and  2nd IT  Plan:  Continue routine obstetrical care  Next visit: prefers will be in person for u/s     Reviewed: Preterm labor symptoms and general obstetric precautions including but not limited to vaginal bleeding, contractions, leaking of fluid and fetal movement were reviewed in detail with the patient.  All questions were answered. Does have home bp cuff. Office bp cuff given: not applicable. Check bp weekly, let us know if consistently >140 and/or >90.  Follow-up: Return for As scheduled.  Future Appointments  Date Time Provider Department Center  06/03/2023  9:15 AM [facility] [place] [facility] None  06/03/2023 10:10 AM Myna Hidalgo, DO [facility]    Orders Placed This Encounter  Procedures   US OB Comp + 14 Wk   INTEGRATED 2   Cheral Marker CNM, [facility] 05/13/2023 9:38 AM

## 2023-05-13 NOTE — Patient Instructions (Signed)
Tiffany Hansen, thank you for choosing our office today! We appreciate the opportunity to meet your healthcare needs. You may receive a short survey by mail, e-mail, or through [employer]. If you are happy with your care we would appreciate if you could take just a few minutes to complete the survey questions. We read all of your comments and take your feedback very seriously. Thank you again for choosing our office.  [facility] [employer] Team at [facility] at [facility] ([address] [place], [place] [address]) Entrance C, located off of [address] Free 24/7 valet parking  Go to [email or web address] to register for FREE online childbirth classes  Call the office [phone]) or go to [facility] if: You begin to severe cramping Your water breaks.  Sometimes it is a big gush of fluid, sometimes it is just a trickle that keeps getting your panties wet or running down your legs You have vaginal bleeding.  It is normal to have a small amount of spotting if your cervix was checked.   [facility] Pediatricians/Family Doctors [facility]): [address] [place]lette Ribas, [phone]           [facility]: [address], [phone]                [facility]): [address], [phone] (call to ask if accepting patients) [facility] Department: [address], [place], [phone]    [facility] Pediatricians/Family Doctors [facility]): [phone] [address], [phone] [facility]: [address] [place], [phone] [facility]: [address], [phone]  [facility] Doctors  [facility]): [phone] [facility]: [address], [phone]   [facility] Doctors [facility]: [address] [phone]  [facility] Doctors  [facility]: [phone], [phone]  Home Blood Pressure Monitoring for Patients   Your provider has recommended that you check your blood pressure (BP) at least once a week at home. If you do not have a blood pressure cuff at home, one will be provided for you. Contact your provider if you have not received your monitor within 1 week.   Helpful Tips for Accurate Home Blood Pressure Checks  Don't smoke, exercise, or drink caffeine 30 minutes before checking your BP Use the restroom before checking your BP (a full bladder can raise your pressure) Relax in a comfortable upright chair Feet on the ground Left arm resting comfortably on a flat surface at the level of your heart Legs uncrossed Back supported Sit quietly and don't talk Place the cuff on your bare arm Adjust snuggly, so that only two fingertips can fit between your skin and the top of the cuff Check 2 readings separated by at least one minute Keep a log of your BP readings For a visual, please reference this diagram: http://ccnc.care/bpdiagram  Provider Name: [facility]     Phone: [phone]  Zone 1: ALL CLEAR  Continue to monitor your symptoms:  BP reading is less than 140 (top number) or less than 90 (bottom number)  No right upper stomach pain No headaches or seeing spots No feeling nauseated or throwing up No swelling in face and hands  Zone 2: CAUTION Call your doctor's office for any of the following:  BP reading is greater than 140 (top number) or greater than  90 (bottom number)  Stomach pain under your ribs in the middle or right side Headaches or seeing spots Feeling nauseated or throwing up Swelling in face and hands  Zone 3: EMERGENCY  Seek immediate medical care if you have any of the following:  BP reading is greater than160 (top number) or greater than 110 (bottom number) Severe headaches not improving with Tylenol Serious difficulty catching your breath Any worsening symptoms from  Zone 2     Second Trimester of Pregnancy The second trimester is from week 14 through week 27 (months 4 through 6). The second trimester is often a time when you feel your best. Your body has adjusted to being pregnant, and you begin to feel better physically. Usually, morning sickness has lessened or quit completely, you may have more energy, and you may have an increase in appetite. The second trimester is also a time when the fetus is growing rapidly. At the end of the sixth month, the fetus is about 9 inches long and weighs about 1 pounds. You will likely begin to feel the baby move (quickening) between 16 and 20 weeks of pregnancy. Body changes during your second trimester Your body continues to go through many changes during your second trimester. The changes vary from woman to woman. Your weight will continue to increase. You will notice your lower abdomen bulging out. You may begin to get stretch marks on your hips, abdomen, and breasts. You may develop headaches that can be relieved by medicines. The medicines should be approved by your health care provider. You may urinate more often because the fetus is pressing on your bladder. You may develop or continue to have heartburn as a result of your pregnancy. You may develop constipation because certain hormones are causing the muscles that push waste through your intestines to slow down. You may develop hemorrhoids or swollen, bulging veins (varicose veins). You may have back pain. This is caused by: Weight gain. Pregnancy hormones that are relaxing the joints in your pelvis. A shift in weight and the muscles that support your balance. Your breasts will continue to grow and they will continue to become tender. Your gums may bleed and may be sensitive to brushing and flossing. Dark spots or blotches (chloasma, mask of pregnancy) may develop on your face. This will likely fade after the baby is born. A dark line from your belly button to  the pubic area (linea nigra) may appear. This will likely fade after the baby is born. You may have changes in your hair. These can include thickening of your hair, rapid growth, and changes in texture. Some women also have hair loss during or after pregnancy, or hair that feels dry or thin. Your hair will most likely return to normal after your baby is born.  What to expect at prenatal visits During a routine prenatal visit: You will be weighed to make sure you and the fetus are growing normally. Your blood pressure will be taken. Your abdomen will be measured to track your baby's growth. The fetal heartbeat will be listened to. Any test results from the previous visit will be discussed.  Your health care provider may ask you: How you are feeling. If you are feeling the baby move. If you have had any abnormal symptoms, such as leaking fluid, bleeding, severe headaches, or abdominal cramping. If you are using any tobacco products, including cigarettes, chewing tobacco, and electronic cigarettes. If you have any questions.  Other tests that may be performed during   your second trimester include: Blood tests that check for: Low iron levels (anemia). High blood sugar that affects pregnant women (gestational diabetes) between 65 and 28 weeks. Rh antibodies. This is to check for a protein on red blood cells (Rh factor). Urine tests to check for infections, diabetes, or protein in the urine. An ultrasound to confirm the proper growth and development of the baby. An amniocentesis to check for possible genetic problems. Fetal screens for spina bifida and Down syndrome. HIV (human immunodeficiency virus) testing. Routine prenatal testing includes screening for HIV, unless you choose not to have this test.  Follow these instructions at home: Medicines Follow your health care provider's instructions regarding medicine use. Specific medicines may be either safe or unsafe to take during  pregnancy. Take a prenatal vitamin that contains at least 600 micrograms (mcg) of folic acid. If you develop constipation, try taking a stool softener if your health care provider approves. Eating and drinking Eat a balanced diet that includes fresh fruits and vegetables, whole grains, good sources of protein such as meat, eggs, or tofu, and low-fat dairy. Your health care provider will help you determine the amount of weight gain that is right for you. Avoid raw meat and uncooked cheese. These carry germs that can cause birth defects in the baby. If you have low calcium intake from food, talk to your health care provider about whether you should take a daily calcium supplement. Limit foods that are high in fat and processed sugars, such as fried and sweet foods. To prevent constipation: Drink enough fluid to keep your urine clear or pale yellow. Eat foods that are high in fiber, such as fresh fruits and vegetables, whole grains, and beans. Activity Exercise only as directed by your health care provider. Most women can continue their usual exercise routine during pregnancy. Try to exercise for 30 minutes at least 5 days a week. Stop exercising if you experience uterine contractions. Avoid heavy lifting, wear low heel shoes, and practice good posture. A sexual relationship may be continued unless your health care provider directs you otherwise. Relieving pain and discomfort Wear a good support bra to prevent discomfort from breast tenderness. Take warm sitz baths to soothe any pain or discomfort caused by hemorrhoids. Use hemorrhoid cream if your health care provider approves. Rest with your legs elevated if you have leg cramps or low back pain. If you develop varicose veins, wear support hose. Elevate your feet for 15 minutes, 3-4 times a day. Limit salt in your diet. Prenatal Care Write down your questions. Take them to your prenatal visits. Keep all your prenatal visits as told by your health  care provider. This is important. Safety Wear your seat belt at all times when driving. Make a list of emergency phone numbers, including numbers for family, friends, the hospital, and police and fire departments. General instructions Ask your health care provider for a referral to a local prenatal education class. Begin classes no later than the beginning of month 6 of your pregnancy. Ask for help if you have counseling or nutritional needs during pregnancy. Your health care provider can offer advice or refer you to specialists for help with various needs. Do not use hot tubs, steam rooms, or saunas. Do not douche or use tampons or scented sanitary pads. Do not cross your legs for long periods of time. Avoid cat litter boxes and soil used by cats. These carry germs that can cause birth defects in the baby and possibly loss of the  fetus by miscarriage or stillbirth. Avoid all smoking, herbs, alcohol, and unprescribed drugs. Chemicals in these products can affect the formation and growth of the baby. Do not use any products that contain nicotine or tobacco, such as cigarettes and e-cigarettes. If you need help quitting, ask your health care provider. Visit your dentist if you have not gone yet during your pregnancy. Use a soft toothbrush to brush your teeth and be gentle when you floss. Contact a health care provider if: You have dizziness. You have mild pelvic cramps, pelvic pressure, or nagging pain in the abdominal area. You have persistent nausea, vomiting, or diarrhea. You have a bad smelling vaginal discharge. You have pain when you urinate. Get help right away if: You have a fever. You are leaking fluid from your vagina. You have spotting or bleeding from your vagina. You have severe abdominal cramping or pain. You have rapid weight gain or weight loss. You have shortness of breath with chest pain. You notice sudden or extreme swelling of your face, hands, ankles, feet, or legs. You  have not felt your baby move in over an hour. You have severe headaches that do not go away when you take medicine. You have vision changes. Summary The second trimester is from week 14 through week 27 (months 4 through 6). It is also a time when the fetus is growing rapidly. Your body goes through many changes during pregnancy. The changes vary from woman to woman. Avoid all smoking, herbs, alcohol, and unprescribed drugs. These chemicals affect the formation and growth your baby. Do not use any tobacco products, such as cigarettes, chewing tobacco, and e-cigarettes. If you need help quitting, ask your health care provider. Contact your health care provider if you have any questions. Keep all prenatal visits as told by your health care provider. This is important. This information is not intended to replace advice given to you by your health care provider. Make sure you discuss any questions you have with your health care provider. Document Released: 10/30/2001 Document Revised: 04/12/2016 Document Reviewed: 01/06/2013 Elsevier Interactive Patient Education  2017 [employer]  

## 2023-05-14 LAB — CERVICOVAGINAL ANCILLARY ONLY
Bacterial Vaginitis (gardnerella): NEGATIVE
Candida Glabrata: NEGATIVE
Candida Vaginitis: NEGATIVE
Chlamydia: NEGATIVE
Comment: NEGATIVE
Comment: NEGATIVE
Comment: NEGATIVE
Comment: NEGATIVE
Comment: NEGATIVE
Comment: NORMAL
Neisseria Gonorrhea: NEGATIVE
Trichomonas: NEGATIVE

## 2023-05-15 ENCOUNTER — Encounter: Payer: Self-pay | Admitting: Obstetrics & Gynecology

## 2023-05-15 LAB — INTEGRATED 2
AFP MoM: 0.9
Alpha-Fetoprotein: 31.5 ng/mL
Crown Rump Length: 71.3 mm
DIA MoM: 0.7
DIA Value: 116.3 pg/mL
Estriol, Unconjugated: 1.44 ng/mL
Gest. Age on Collection Date: 13.1 weeks
Gestational Age: 16.1 weeks
Maternal Age at EDD: 19.5 yr
Nuchal Translucency (NT): 1.5 mm
Nuchal Translucency MoM: 0.92
Number of Fetuses: 1
PAPP-A MoM: 0.62
PAPP-A Value: 916.3 ng/mL
Test Results:: NEGATIVE
Weight: 133 [lb_av]
Weight: 133 [lb_av]
hCG MoM: 0.86
hCG Value: 31.6 IU/mL
uE3 MoM: 1.43

## 2023-05-17 ENCOUNTER — Inpatient Hospital Stay (HOSPITAL_COMMUNITY)
Admission: AD | Admit: 2023-05-17 | Discharge: 2023-05-17 | Disposition: A | Discharge status: Home / Self Care | Payer: Medicaid Other | Attending: Obstetrics & Gynecology | Admitting: Obstetrics & Gynecology

## 2023-05-17 ENCOUNTER — Inpatient Hospital Stay (HOSPITAL_BASED_OUTPATIENT_CLINIC_OR_DEPARTMENT_OTHER): Payer: Medicaid Other

## 2023-05-17 DIAGNOSIS — M549 Dorsalgia, unspecified: Secondary | ICD-10-CM | POA: Insufficient documentation

## 2023-05-17 DIAGNOSIS — O99891 Other specified diseases and conditions complicating pregnancy: Secondary | ICD-10-CM | POA: Insufficient documentation

## 2023-05-17 DIAGNOSIS — O26892 Other specified pregnancy related conditions, second trimester: Secondary | ICD-10-CM | POA: Insufficient documentation

## 2023-05-17 DIAGNOSIS — R1084 Generalized abdominal pain: Secondary | ICD-10-CM

## 2023-05-17 DIAGNOSIS — Z3A16 16 weeks gestation of pregnancy: Secondary | ICD-10-CM

## 2023-05-17 LAB — URINALYSIS, ROUTINE W REFLEX MICROSCOPIC
Bilirubin Urine: NEGATIVE
Glucose, UA: NEGATIVE mg/dL
Hgb urine dipstick: NEGATIVE
Ketones, ur: NEGATIVE mg/dL
Leukocytes,Ua: NEGATIVE
Nitrite: NEGATIVE
Protein, ur: NEGATIVE mg/dL
Specific Gravity, Urine: 1.014 (ref 1.005–1.030)
pH: 8 (ref 5.0–8.0)

## 2023-05-17 MED ORDER — PHENAZOPYRIDINE HCL 200 MG PO TABS: 200.0000 mg | ORAL_TABLET | Freq: Three times a day (TID) | ORAL | 0 refills | Status: DC | PRN

## 2023-05-17 NOTE — MAU Provider Note (Signed)
History     CSN: [id number]  Arrival date and time: 05/17/23 1353   None     Chief Complaint  Patient presents with   Back Pain    Radiating from abdomen to back. Feels cramp-like.    HPI  [name] [id number] @ [redacted]w[redacted]d with a history of recurrent UTI's  here in MAU with complaints of lower back pain and lower abdominal pain. The pain is cramp like and shooting at time. The pain started on Tuesday. The pain comes and goes. The pain was so bad that it made her feel like she was going to pass out.  She has never had this pain before  OB History     Gravida  3   Para  1   Term  0   Preterm  1   AB  1   Living  1      SAB  1   IAB  0   Ectopic  0   Multiple  0   Live Births  1           Past Medical History:  Diagnosis Date   Abdominal pain, recurrent    Anxiety    Constipation, chronic    Depression    History of UTI     Past Surgical History:  Procedure Laterality Date   NO PAST SURGERIES      Family History  Problem Relation Age of Onset   Celiac disease Neg Hx    Ulcers Neg Hx    Cholelithiasis Neg Hx     Social History   Tobacco Use   Smoking status: Former    Types: Cigarettes    Quit date: 03/2021    Years since quitting: 2.1   Smokeless tobacco: Never  Vaping Use   Vaping Use: Former   Substances: Nicotine  Substance Use Topics   Alcohol use: No   Drug use: No    Allergies:  Allergies  Allergen Reactions   Amoxicillin Itching    Medications Prior to Admission  Medication Sig Dispense Refill Last Dose   Prenatal Vit-Fe Fumarate-FA (PRENATAL VITAMIN PO) Take by mouth.   05/16/2023   sertraline (ZOLOFT) 25 MG tablet Take 1 tablet (25 mg total) by mouth daily. 30 tablet 3 05/16/2023   acetaminophen (TYLENOL) 325 MG tablet Take 2 tablets (650 mg total) by mouth every 4 (four) hours as needed (for pain scale < 4). (Patient not taking: Reported on 05/13/2023)      Blood Pressure Monitor MISC For regular home bp monitoring  during pregnancy (Patient not taking: Reported on 05/13/2023) 1 each 0    hydrOXYzine (ATARAX) 10 MG tablet Take 1 tablet (10 mg total) by mouth every 8 (eight) hours as needed. For anxiety (Patient not taking: Reported on 05/13/2023) 30 tablet 1    Results for orders placed or performed during the hospital encounter of 05/17/23 (from the past 48 hour(s))  Urinalysis, Routine w reflex microscopic -Urine, Clean Catch     Status: Abnormal   Collection Time: 05/17/23  2:59 PM  Result Value Ref Range   Color, Urine YELLOW YELLOW   APPearance CLOUDY (A) CLEAR   Specific Gravity, Urine 1.014 1.005 - 1.030   pH 8.0 5.0 - 8.0   Glucose, UA NEGATIVE NEGATIVE mg/dL   Hgb urine dipstick NEGATIVE NEGATIVE   Bilirubin Urine NEGATIVE NEGATIVE   Ketones, ur NEGATIVE NEGATIVE mg/dL   Protein, ur NEGATIVE NEGATIVE mg/dL   Nitrite NEGATIVE NEGATIVE   Leukocytes,Ua  NEGATIVE NEGATIVE    Comment: Performed at [facility], [address], [place], [place] [address]    Review of Systems  Constitutional:  Negative for fever.  Gastrointestinal:  Positive for abdominal pain and nausea. Negative for vomiting.  Genitourinary:  Negative for flank pain.  Musculoskeletal:  Positive for back pain.   Physical Exam   Blood pressure 111/65, pulse (!) 111, temperature 97.7 F (36.5 C), temperature source Oral, resp. rate 18, height 5\' 2"  (1.575 m), weight 61.2 kg, last menstrual period 01/21/2023, SpO2 100 %, not currently breastfeeding.  Physical Exam Constitutional:      General: She is not in acute distress.    Appearance: Normal appearance. She is not ill-appearing, toxic-appearing or diaphoretic.  HENT:     Head: Normocephalic.  Abdominal:     Palpations: Abdomen is soft.     Tenderness: There is no abdominal tenderness. There is no right CVA tenderness, left CVA tenderness, guarding or rebound.  Genitourinary:    Comments: Cervix long, closed, and posterior. Exam by Venia Carbon, NP Skin:     General: Skin is warm.  Neurological:     Mental Status: She is alert and oriented to person, place, and time.  Psychiatric:        Behavior: Behavior normal.    MAU Course  Procedures  MDM  + fetal heart tones via doppler Normal cervical length on [place] UA without acute findings Urine culture pending.   Assessment and Plan   A:  1. Back pain in pregnancy   2. Abdominal pain during pregnancy in second trimester      P:  Dc home Strict return precautions Would consider Renal US if patient returns with worsening pain  Rx: Pyridium.   Duane Lope, NP 05/17/2023 9:04 PM

## 2023-05-17 NOTE — MAU Note (Signed)
Has been having cramping since Tuesday, which has increased in intensity. States that pain was sharp today, and "felt like I would pass out." She states that she got her partner to drive here due to pain. Was seen on Monday at [facility] office and stringy vaginal discharge noted, but states there was no infection at that time. Concerned due to intensity of pain. No VB, no LOF, is not yet feeling baby move. Had intercourse yesterday. Reports she is able to eat and drink normally.

## 2023-05-18 LAB — CULTURE, OB URINE: Culture: 20000 — AB

## 2023-05-19 ENCOUNTER — Encounter: Payer: Self-pay | Admitting: Obstetrics & Gynecology

## 2023-05-19 LAB — CULTURE, OB URINE

## 2023-05-20 ENCOUNTER — Other Ambulatory Visit: Payer: Self-pay | Admitting: Women's Health

## 2023-05-20 DIAGNOSIS — R8271 Bacteriuria: Secondary | ICD-10-CM | POA: Insufficient documentation

## 2023-05-20 MED ORDER — FOSFOMYCIN TROMETHAMINE 3 G PO PACK: 3.0000 g | PACK | Freq: Once | ORAL | 0 refills | Status: AC

## 2023-05-25 ENCOUNTER — Encounter: Payer: Self-pay | Admitting: Obstetrics & Gynecology

## 2023-05-26 ENCOUNTER — Encounter: Payer: Self-pay | Admitting: Obstetrics & Gynecology

## 2023-06-03 ENCOUNTER — Ambulatory Visit (INDEPENDENT_AMBULATORY_CARE_PROVIDER_SITE_OTHER): Payer: MEDICAID | Admitting: Obstetrics & Gynecology

## 2023-06-03 ENCOUNTER — Encounter: Payer: Self-pay | Admitting: Obstetrics & Gynecology

## 2023-06-03 ENCOUNTER — Ambulatory Visit: Payer: MEDICAID

## 2023-06-03 VITALS — BP 111/61 | HR 83 | Wt 136.8 lb

## 2023-06-03 DIAGNOSIS — Z3480 Encounter for supervision of other normal pregnancy, unspecified trimester: Secondary | ICD-10-CM

## 2023-06-03 DIAGNOSIS — Z3A19 19 weeks gestation of pregnancy: Secondary | ICD-10-CM

## 2023-06-03 DIAGNOSIS — Z3482 Encounter for supervision of other normal pregnancy, second trimester: Secondary | ICD-10-CM | POA: Diagnosis not present

## 2023-06-03 DIAGNOSIS — R8271 Bacteriuria: Secondary | ICD-10-CM

## 2023-06-03 DIAGNOSIS — O99891 Other specified diseases and conditions complicating pregnancy: Secondary | ICD-10-CM

## 2023-06-03 DIAGNOSIS — Z363 Encounter for antenatal screening for malformations: Secondary | ICD-10-CM | POA: Diagnosis not present

## 2023-06-03 DIAGNOSIS — Z87440 Personal history of urinary (tract) infections: Secondary | ICD-10-CM

## 2023-06-03 NOTE — Progress Notes (Signed)
   LOW-RISK PREGNANCY VISIT Patient name: Tiffany Hansen MRN [id number]  Date of birth: 2006-09-27 Chief Complaint:   Routine Prenatal Visit  History of Present Illness:   Tiffany Hansen is a 17 y.o. G3P0111 female at [redacted]w[redacted]d with an Estimated Date of Delivery: 10/28/23 being seen today for ongoing management of a low-risk pregnancy.   -Anxiety/Depression- on atarax -h/o recurrent UTIs- currently asymptomatic, []  repeat today     04/22/2023   10:10 AM 03/06/2023   12:04 PM 08/02/2022    2:31 PM 03/01/2022   11:01 AM 12/04/2021    9:29 AM  Depression screen PHQ 2/9  Decreased Interest 0 2 0 0 0  Down, Depressed, Hopeless 0 0 0 0 0  PHQ - 2 Score 0 2 0 0 0  Altered sleeping 0 0 0 0 0  Tired, decreased energy 0 1 0 3 0  Change in appetite 0 0 0 0 0  Feeling bad or failure about yourself  0 0 0 0 0  Trouble concentrating 0 1 0 0 0  Moving slowly or fidgety/restless 0 0 0 0 0  Suicidal thoughts 0 0 0 0 0  PHQ-9 Score 0 4 0 3 0    Today she reports no complaints. Contractions: Not present. Vag. Bleeding: None.  Movement: Present. denies leaking of fluid. Review of Systems:   Pertinent items are noted in HPI Denies abnormal vaginal discharge w/ itching/odor/irritation, headaches, visual changes, shortness of breath, chest pain, abdominal pain, severe nausea/vomiting, or problems with urination or bowel movements unless otherwise stated above. Pertinent History Reviewed:  Reviewed past medical,surgical, social, obstetrical and family history.  Reviewed problem list, medications and allergies.  Physical Assessment:   Vitals:   06/03/23 1005  BP: 111/61  Pulse: 83  Weight: 136 lb 12.8 oz (62.1 kg)  Body mass index is 25.02 kg/m.        Physical Examination:   General appearance: Well appearing, and in no distress  Mental status: Alert, oriented to person, place, and time  Skin: Warm & dry  Respiratory: Normal respiratory effort, no distress  Abdomen: Soft, gravid, nontender  Pelvic:  Cervical exam deferred         Extremities: Edema: None  Psych:  mood and affect appropriate  Fetal Status:     Movement: Present   cephalic,anterior placenta gr 0,normal ovaries,CX 3.9 cm,SVP of fluid 3.5 cm,EFW 250 g 26%,anatomy complete,no obvious abnormalities   Chaperone: n/a    No results found for this or any previous visit (from the past 24 hour(s)).   Assessment & Plan:  1) Low-risk pregnancy G3P0111 at [redacted]w[redacted]d with an Estimated Date of Delivery: 10/28/23   -normal anatomy scan -recurrent UTIs []  TOC today   Meds: No orders of the defined types were placed in this encounter.  Labs/procedures today: anatomy scan  Plan:  Continue routine obstetrical care  Next visit: prefers in person    Reviewed: Preterm labor symptoms and general obstetric precautions including but not limited to vaginal bleeding, contractions, leaking of fluid and fetal movement were reviewed in detail with the patient.  All questions were answered.   Follow-up: Return in about 4 weeks (around 07/01/2023) for LROB visit.  Orders Placed This Encounter  Procedures   Urine Culture    Myna Hidalgo, DO Attending Obstetrician & Gynecologist, [facility] for [facility], [facility]

## 2023-06-03 NOTE — Progress Notes (Signed)
[place] 19 wks,cephalic,anterior placenta gr 0,normal ovaries,CX 3.9 cm,SVP of fluid 3.5 cm,EFW 250 g 26%,anatomy complete,no obvious abnormalities

## 2023-06-05 ENCOUNTER — Encounter: Payer: Self-pay | Admitting: Obstetrics & Gynecology

## 2023-06-05 LAB — URINE CULTURE

## 2023-06-19 ENCOUNTER — Encounter: Payer: Self-pay | Admitting: Obstetrics & Gynecology

## 2023-06-23 ENCOUNTER — Encounter: Payer: Self-pay | Admitting: Obstetrics & Gynecology

## 2023-06-24 ENCOUNTER — Other Ambulatory Visit: Payer: MEDICAID

## 2023-06-24 DIAGNOSIS — Z87440 Personal history of urinary (tract) infections: Secondary | ICD-10-CM

## 2023-06-24 DIAGNOSIS — R350 Frequency of micturition: Secondary | ICD-10-CM

## 2023-06-24 DIAGNOSIS — Z3482 Encounter for supervision of other normal pregnancy, second trimester: Secondary | ICD-10-CM

## 2023-06-24 DIAGNOSIS — Z3A22 22 weeks gestation of pregnancy: Secondary | ICD-10-CM

## 2023-06-24 DIAGNOSIS — Z3480 Encounter for supervision of other normal pregnancy, unspecified trimester: Secondary | ICD-10-CM

## 2023-06-24 LAB — POCT URINALYSIS DIPSTICK OB
Blood, UA: NEGATIVE
Glucose, UA: NEGATIVE
Ketones, UA: NEGATIVE
Leukocytes, UA: NEGATIVE
Nitrite, UA: NEGATIVE
POC,PROTEIN,UA: NEGATIVE

## 2023-06-24 NOTE — Progress Notes (Signed)
   NURSE VISIT- UTI SYMPTOMS   SUBJECTIVE:  Tiffany Hansen is a 17 y.o. [phone] female here for UTI symptoms. She is [redacted]w[redacted]d pregnant. She reports urinary frequency and recent UTI .  OBJECTIVE:  LMP 01/21/2023   Appears well, in no apparent distress  Results for orders placed or performed in visit on 06/24/23 (from the past 24 hour(s))  POC Urinalysis Dipstick OB   Collection Time: 06/24/23  4:12 PM  Result Value Ref Range   Color, UA     Clarity, UA     Glucose, UA Negative Negative   Bilirubin, UA     Ketones, UA neg    Spec Grav, UA     Blood, UA neg    pH, UA     POC,PROTEIN,UA Negative Negative, Trace, Small (1+), Moderate (2+), Large (3+), 4+   Urobilinogen, UA     Nitrite, UA neg    Leukocytes, UA Negative Negative   Appearance     Odor      ASSESSMENT: Pregnancy [redacted]w[redacted]d with UTI symptoms and negative nitrites  PLAN: Note routed to Dr. Charlotta Newton   Rx sent by provider today: No Urine culture sent Call or return to clinic prn if these symptoms worsen or fail to improve as anticipated. Follow-up: as scheduled   Jobe Marker  06/24/2023 4:13 PM

## 2023-06-26 ENCOUNTER — Other Ambulatory Visit: Payer: Self-pay | Admitting: Obstetrics & Gynecology

## 2023-06-26 DIAGNOSIS — N3000 Acute cystitis without hematuria: Secondary | ICD-10-CM

## 2023-06-26 MED ORDER — NITROFURANTOIN MONOHYD MACRO 100 MG PO CAPS
100.0000 mg | ORAL_CAPSULE | Freq: Two times a day (BID) | ORAL | 0 refills | Status: AC
Start: 2023-06-26 — End: 2023-07-03

## 2023-06-26 NOTE — Progress Notes (Signed)
Rx for UTI  Janyth Pupa, DO Attending Farmer, [facility] for [facility], [place]

## 2023-07-01 ENCOUNTER — Encounter: Payer: Self-pay | Admitting: Women's Health

## 2023-07-01 ENCOUNTER — Ambulatory Visit (INDEPENDENT_AMBULATORY_CARE_PROVIDER_SITE_OTHER): Payer: MEDICAID | Admitting: Women's Health

## 2023-07-01 ENCOUNTER — Encounter: Payer: Self-pay | Admitting: Obstetrics & Gynecology

## 2023-07-01 VITALS — BP 114/67 | HR 103 | Wt 142.0 lb

## 2023-07-01 DIAGNOSIS — Z3480 Encounter for supervision of other normal pregnancy, unspecified trimester: Secondary | ICD-10-CM

## 2023-07-01 DIAGNOSIS — Z3482 Encounter for supervision of other normal pregnancy, second trimester: Secondary | ICD-10-CM

## 2023-07-01 DIAGNOSIS — R8271 Bacteriuria: Secondary | ICD-10-CM

## 2023-07-01 DIAGNOSIS — O99891 Other specified diseases and conditions complicating pregnancy: Secondary | ICD-10-CM

## 2023-07-01 DIAGNOSIS — Z3A23 23 weeks gestation of pregnancy: Secondary | ICD-10-CM

## 2023-07-01 NOTE — Progress Notes (Signed)
LOW-RISK PREGNANCY VISIT Patient name: Tiffany Hansen MRN [id number]  Date of birth: 2006/10/26 Chief Complaint:   Routine Prenatal Visit  History of Present Illness:   Tiffany Hansen is a 17 y.o. G3P0111 female at [redacted]w[redacted]d with an Estimated Date of Delivery: 10/28/23 being seen today for ongoing management of a low-risk pregnancy.   Today she reports  on antibiotics for another UTI . Contractions: Not present. Vag. Bleeding: None.  Movement: Present. denies leaking of fluid.     04/22/2023   10:10 AM 03/06/2023   12:04 PM 08/02/2022    2:31 PM 03/01/2022   11:01 AM 12/04/2021    9:29 AM  Depression screen PHQ 2/9  Decreased Interest 0 2 0 0 0  Down, Depressed, Hopeless 0 0 0 0 0  PHQ - 2 Score 0 2 0 0 0  Altered sleeping 0 0 0 0 0  Tired, decreased energy 0 1 0 3 0  Change in appetite 0 0 0 0 0  Feeling bad or failure about yourself  0 0 0 0 0  Trouble concentrating 0 1 0 0 0  Moving slowly or fidgety/restless 0 0 0 0 0  Suicidal thoughts 0 0 0 0 0  PHQ-9 Score 0 4 0 3 0        04/22/2023   10:11 AM 03/06/2023   12:06 PM 08/02/2022    2:31 PM 03/01/2022   11:02 AM  GAD 7 : Generalized Anxiety Score  Nervous, Anxious, on Edge 1 3 0 0  Control/stop worrying 0 3 0 0  Worry too much - different things 0 3 0 0  Trouble relaxing 0 1 0 0  Restless 0 0 0 0  Easily annoyed or irritable 0 0 0 0  Afraid - awful might happen 0 1 0 0  Total GAD 7 Score 1 11 0 0      Review of Systems:   Pertinent items are noted in HPI Denies abnormal vaginal discharge w/ itching/odor/irritation, headaches, visual changes, shortness of breath, chest pain, abdominal pain, severe nausea/vomiting, or problems with urination or bowel movements unless otherwise stated above. Pertinent History Reviewed:  Reviewed past medical,surgical, social, obstetrical and family history.  Reviewed problem list, medications and allergies. Physical Assessment:   Vitals:   07/01/23 1040  BP: 114/67  Pulse: (!) 103  Weight:  142 lb (64.4 kg)  Body mass index is 25.97 kg/m.        Physical Examination:   General appearance: Well appearing, and in no distress  Mental status: Alert, oriented to person, place, and time  Skin: Warm & dry  Cardiovascular: Normal heart rate noted  Respiratory: Normal respiratory effort, no distress  Abdomen: Soft, gravid, nontender  Pelvic: Cervical exam deferred         Extremities: Edema: None  Fetal Status: Fetal Heart Rate (bpm): 138 Fundal Height: 23 cm Movement: Present    Chaperone: N/A   No results found for this or any previous visit (from the past 24 hour(s)).  Assessment & Plan:  1) Low-risk pregnancy G3P0111 at [redacted]w[redacted]d with an Estimated Date of Delivery: 10/28/23   2) H/O 35wk PTB  3) Recent UTI> still on antibiotics, urine cx next visit   Meds: No orders of the defined types were placed in this encounter.  Labs/procedures today: none  Plan:  Continue routine obstetrical care  Next visit: prefers will be in person for pn2     Reviewed: Preterm labor symptoms and general obstetric precautions  including but not limited to vaginal bleeding, contractions, leaking of fluid and fetal movement were reviewed in detail with the patient.  All questions were answered. Does have home bp cuff. Office bp cuff given: not applicable. Check bp weekly, let us know if consistently >140 and/or >90.  Follow-up: Return in about 4 weeks (around 07/29/2023) for LROB, PN2, CNM.  No future appointments.  No orders of the defined types were placed in this encounter.  Cheral Marker CNM, [facility] 07/01/2023 11:07 AM

## 2023-07-01 NOTE — Patient Instructions (Signed)
Tiffany Hansen, thank you for choosing our office today! We appreciate the opportunity to meet your healthcare needs. You may receive a short survey by mail, e-mail, or through [employer]. If you are happy with your care we would appreciate if you could take just a few minutes to complete the survey questions. We read all of your comments and take your feedback very seriously. Thank you again for choosing our office.  Center for [employer] Team at [facility]  [facility] at [facility] ([address] [place], [place] [address]) Entrance C, located off of [address] Free 24/7 valet parking   You will have your sugar test next visit.  Please do not eat or drink anything after midnight the night before you come, not even water.  You will be here for at least two hours.  Please make an appointment online for the bloodwork at [email or web address] for 8:00am (or as close to this as possible). Make sure you select the [facility] ([facility]) service center.   CLASSES: Go to [email or web address] to register for classes (childbirth, breastfeeding, waterbirth, infant CPR, daddy bootcamp, etc.)  Call the office [phone]) or go to [facility] if: You begin to have strong, frequent contractions Your water breaks.  Sometimes it is a big gush of fluid, sometimes it is just a trickle that keeps getting your panties wet or running down your legs You have vaginal bleeding.  It is normal to have a small amount of spotting if your cervix was checked.  You don't feel your baby moving like normal.  If you don't, get you something to eat and drink and lay down and focus on feeling your baby move.   If your baby is still not moving like normal, you should call the office or go to [facility].  Call the office [phone]) or go to [facility] hospital for these signs of pre-eclampsia: Severe headache that does not go away with Tylenol Visual changes- seeing spots, double, blurred vision Pain under your right breast or upper  abdomen that does not go away with Tums or heartburn medicine Nausea and/or vomiting Severe swelling in your hands, feet, and face    [place] Pediatricians/Family Doctors [facility]): [address] [place], [phone]           [facility]: [address], [phone]                [facility]): [address], [phone]  [facility] Department: [address], [place], [phone]    [facility] Pediatricians/Family Doctors [facility]): [address], [phone] [facility]: [address] [place], [phone] [facility]: [address], [phone]  [facility] Doctors  [facility]): [phone] [facility]: [address] [phone]   [facility] Doctors [facility]: [address], [phone]  [facility] Doctors  [facility]: [phone], [phone]  Home Blood Pressure Monitoring for Patients   Your provider has recommended that you check your blood pressure (BP) at least once a week at home. If you do not have a blood pressure cuff at home, one will be provided for you. Contact your provider if you have not received your monitor within 1 week.   Helpful Tips for Accurate Home Blood Pressure Checks  Don't smoke, exercise, or drink caffeine 30 minutes before checking  your BP Use the restroom before checking your BP (a full bladder can raise your pressure) Relax in a comfortable upright chair Feet on the ground Left arm resting comfortably on a flat surface at the level of your heart Legs uncrossed Back supported Sit quietly and don't talk Place the cuff on your bare arm Adjust snuggly, so that only two fingertips can fit between your skin and the top of the cuff Check 2 readings separated by at least one  minute Keep a log of your BP readings For a visual, please reference this diagram: http://ccnc.care/bpdiagram  Provider Name: [facility]     Phone: [phone]  Zone 1: ALL CLEAR  Continue to monitor your symptoms:  BP reading is less than 140 (top number) or less than 90 (bottom number)  No right upper stomach pain No headaches or seeing spots No feeling nauseated or throwing up No swelling in face and hands  Zone 2: CAUTION Call your doctor's office for any of the following:  BP reading is greater than 140 (top number) or greater than 90 (bottom number)  Stomach pain under your ribs in the middle or right side Headaches or seeing spots Feeling nauseated or throwing up Swelling in face and hands  Zone 3: EMERGENCY  Seek immediate medical care if you have any of the following:  BP reading is greater than160 (top number) or greater than 110 (bottom number) Severe headaches not improving with Tylenol Serious difficulty catching your breath Any worsening symptoms from Zone 2   Second Trimester of Pregnancy The second trimester is from week 13 through week 28, months 4 through 6. The second trimester is often a time when you feel your best. Your body has also adjusted to being pregnant, and you begin to feel better physically. Usually, morning sickness has lessened or quit completely, you may have more energy, and you may have an increase in appetite. The second trimester is also a time when the fetus is growing rapidly. At the end of the sixth month, the fetus is about 9 inches long and weighs about 1 pounds. You will likely begin to feel the baby move (quickening) between 18 and 20 weeks of the pregnancy. BODY CHANGES Your body goes through many changes during pregnancy. The changes vary from woman to woman.  Your weight will continue to increase. You will notice your lower abdomen bulging out. You may begin to get stretch marks on your hips, abdomen, and breasts. You may  develop headaches that can be relieved by medicines approved by your health care provider. You may urinate more often because the fetus is pressing on your bladder. You may develop or continue to have heartburn as a result of your pregnancy. You may develop constipation because certain hormones are causing the muscles that push waste through your intestines to slow down. You may develop hemorrhoids or swollen, bulging veins (varicose veins). You may have back pain because of the weight gain and pregnancy hormones relaxing your joints between the bones in your pelvis and as a result of a shift in weight and the muscles that support your balance. Your breasts will continue to grow and be tender. Your gums may bleed and may be sensitive to brushing and flossing. Dark spots or blotches (chloasma, mask of pregnancy) may develop on your face. This will likely fade after the baby is born. A dark line from your belly button to the pubic area (linea nigra) may appear. This will likely fade after the  baby is born. You may have changes in your hair. These can include thickening of your hair, rapid growth, and changes in texture. Some women also have hair loss during or after pregnancy, or hair that feels dry or thin. Your hair will most likely return to normal after your baby is born. WHAT TO EXPECT AT YOUR PRENATAL VISITS During a routine prenatal visit: You will be weighed to make sure you and the fetus are growing normally. Your blood pressure will be taken. Your abdomen will be measured to track your baby's growth. The fetal heartbeat will be listened to. Any test results from the previous visit will be discussed. Your health care provider may ask you: How you are feeling. If you are feeling the baby move. If you have had any abnormal symptoms, such as leaking fluid, bleeding, severe headaches, or abdominal cramping. If you have any questions. Other tests that may be performed during your second  trimester include: Blood tests that check for: Low iron levels (anemia). Gestational diabetes (between 24 and 28 weeks). Rh antibodies. Urine tests to check for infections, diabetes, or protein in the urine. An ultrasound to confirm the proper growth and development of the baby. An amniocentesis to check for possible genetic problems. Fetal screens for spina bifida and Down syndrome. HOME CARE INSTRUCTIONS  Avoid all smoking, herbs, alcohol, and unprescribed drugs. These chemicals affect the formation and growth of the baby. Follow your health care provider's instructions regarding medicine use. There are medicines that are either safe or unsafe to take during pregnancy. Exercise only as directed by your health care provider. Experiencing uterine cramps is a good sign to stop exercising. Continue to eat regular, healthy meals. Wear a good support bra for breast tenderness. Do not use hot tubs, steam rooms, or saunas. Wear your seat belt at all times when driving. Avoid raw meat, uncooked cheese, cat litter boxes, and soil used by cats. These carry germs that can cause birth defects in the baby. Take your prenatal vitamins. Try taking a stool softener (if your health care provider approves) if you develop constipation. Eat more high-fiber foods, such as fresh vegetables or fruit and whole grains. Drink plenty of fluids to keep your urine clear or pale yellow. Take warm sitz baths to soothe any pain or discomfort caused by hemorrhoids. Use hemorrhoid cream if your health care provider approves. If you develop varicose veins, wear support hose. Elevate your feet for 15 minutes, 3-4 times a day. Limit salt in your diet. Avoid heavy lifting, wear low heel shoes, and practice good posture. Rest with your legs elevated if you have leg cramps or low back pain. Visit your dentist if you have not gone yet during your pregnancy. Use a soft toothbrush to brush your teeth and be gentle when you floss. A  sexual relationship may be continued unless your health care provider directs you otherwise. Continue to go to all your prenatal visits as directed by your health care provider. SEEK MEDICAL CARE IF:  You have dizziness. You have mild pelvic cramps, pelvic pressure, or nagging pain in the abdominal area. You have persistent nausea, vomiting, or diarrhea. You have a bad smelling vaginal discharge. You have pain with urination. SEEK IMMEDIATE MEDICAL CARE IF:  You have a fever. You are leaking fluid from your vagina. You have spotting or bleeding from your vagina. You have severe abdominal cramping or pain. You have rapid weight gain or loss. You have shortness of breath with chest pain. You  notice sudden or extreme swelling of your face, hands, ankles, feet, or legs. You have not felt your baby move in over an hour. You have severe headaches that do not go away with medicine. You have vision changes. Document Released: 10/30/2001 Document Revised: 11/10/2013 Document Reviewed: 01/06/2013 [facility] Patient Information 2015 [place], [place]. This information is not intended to replace advice given to you by your health care provider. Make sure you discuss any questions you have with your health care provider.

## 2023-07-29 ENCOUNTER — Ambulatory Visit (INDEPENDENT_AMBULATORY_CARE_PROVIDER_SITE_OTHER): Payer: MEDICAID | Admitting: Obstetrics & Gynecology

## 2023-07-29 ENCOUNTER — Other Ambulatory Visit: Payer: MEDICAID

## 2023-07-29 VITALS — BP 118/60 | HR 91 | Wt 147.8 lb

## 2023-07-29 DIAGNOSIS — Z87440 Personal history of urinary (tract) infections: Secondary | ICD-10-CM

## 2023-07-29 DIAGNOSIS — Z3482 Encounter for supervision of other normal pregnancy, second trimester: Secondary | ICD-10-CM

## 2023-07-29 DIAGNOSIS — Z131 Encounter for screening for diabetes mellitus: Secondary | ICD-10-CM

## 2023-07-29 DIAGNOSIS — Z3A27 27 weeks gestation of pregnancy: Secondary | ICD-10-CM

## 2023-07-29 DIAGNOSIS — Z3480 Encounter for supervision of other normal pregnancy, unspecified trimester: Secondary | ICD-10-CM

## 2023-07-29 DIAGNOSIS — Z3A26 26 weeks gestation of pregnancy: Secondary | ICD-10-CM

## 2023-07-29 NOTE — Progress Notes (Signed)
   LOW-RISK PREGNANCY VISIT Patient name: Tiffany Hansen MRN [id number]  Date of birth: 02-16-2006 Chief Complaint:   Routine Prenatal Visit  History of Present Illness:   Tiffany Hansen is a 17 y.o. G3P0111 female at [redacted]w[redacted]d with an Estimated Date of Delivery: 10/28/23 being seen today for ongoing management of a low-risk pregnancy.   -Recurrent UTIs- currently asymptomatic     04/22/2023   10:10 AM 03/06/2023   12:04 PM 08/02/2022    2:31 PM 03/01/2022   11:01 AM 12/04/2021    9:29 AM  Depression screen PHQ 2/9  Decreased Interest 0 2 0 0 0  Down, Depressed, Hopeless 0 0 0 0 0  PHQ - 2 Score 0 2 0 0 0  Altered sleeping 0 0 0 0 0  Tired, decreased energy 0 1 0 3 0  Change in appetite 0 0 0 0 0  Feeling bad or failure about yourself  0 0 0 0 0  Trouble concentrating 0 1 0 0 0  Moving slowly or fidgety/restless 0 0 0 0 0  Suicidal thoughts 0 0 0 0 0  PHQ-9 Score 0 4 0 3 0    Today she reports no complaints. Contractions: Not present. Vag. Bleeding: None.  Movement: Present. denies leaking of fluid. Review of Systems:   Pertinent items are noted in HPI Denies abnormal vaginal discharge w/ itching/odor/irritation, headaches, visual changes, shortness of breath, chest pain, abdominal pain, severe nausea/vomiting, or problems with urination or bowel movements unless otherwise stated above. Pertinent History Reviewed:  Reviewed past medical,surgical, social, obstetrical and family history.  Reviewed problem list, medications and allergies.  Physical Assessment:   Vitals:   07/29/23 0849  BP: 118/60  Pulse: 91  Weight: 147 lb 12.8 oz (67 kg)  Body mass index is 27.03 kg/m.        Physical Examination:   General appearance: Well appearing, and in no distress  Mental status: Alert, oriented to person, place, and time  Skin: Warm & dry  Respiratory: Normal respiratory effort, no distress  Abdomen: Soft, gravid, nontender  Pelvic: Cervical exam deferred         Extremities:  no edema, no  calf tenderness bilaterally  Psych:  mood and affect appropriate  Fetal Status: Fetal Heart Rate (bpm): 140 Fundal Height: 26 cm Movement: Present    Chaperone: n/a    No results found for this or any previous visit (from the past 24 hour(s)).   Assessment & Plan:  1) Low-risk pregnancy G3P0111 at [redacted]w[redacted]d with an Estimated Date of Delivery: 10/28/23   2) Recurrent UTI -urine culture today   Meds: No orders of the defined types were placed in this encounter.  Labs/procedures today: urine culture, PN2  Plan:  Continue routine obstetrical care  Next visit: prefers in person    Reviewed: Preterm labor symptoms and general obstetric precautions including but not limited to vaginal bleeding, contractions, leaking of fluid and fetal movement were reviewed in detail with the patient.  All questions were answered.   Follow-up: Return in about 3 weeks (around 08/19/2023) for LROB visit.  Orders Placed This Encounter  Procedures   Culture, OB Urine    Myna Hidalgo, DO Attending Obstetrician & Gynecologist, [facility] for [employer], [facility]

## 2023-07-30 ENCOUNTER — Encounter: Payer: Self-pay | Admitting: Obstetrics & Gynecology

## 2023-07-30 DIAGNOSIS — Z862 Personal history of diseases of the blood and blood-forming organs and certain disorders involving the immune mechanism: Secondary | ICD-10-CM | POA: Insufficient documentation

## 2023-07-30 DIAGNOSIS — O99012 Anemia complicating pregnancy, second trimester: Secondary | ICD-10-CM | POA: Insufficient documentation

## 2023-07-30 LAB — CBC
Hematocrit: 30.2 % — ABNORMAL LOW (ref 34.0–46.6)
Hemoglobin: 9.8 g/dL — ABNORMAL LOW (ref 11.1–15.9)
MCH: 28 pg (ref 26.6–33.0)
MCHC: 32.5 g/dL (ref 31.5–35.7)
MCV: 86 fL (ref 79–97)
Platelets: 254 10*3/uL (ref 150–450)
RBC: 3.5 x10E6/uL — ABNORMAL LOW (ref 3.77–5.28)
RDW: 13.2 % (ref 11.7–15.4)
WBC: 8 10*3/uL (ref 3.4–10.8)

## 2023-07-30 LAB — ANTIBODY SCREEN: Antibody Screen: NEGATIVE

## 2023-07-30 LAB — HIV ANTIBODY (ROUTINE TESTING W REFLEX): HIV Screen 4th Generation wRfx: NONREACTIVE

## 2023-07-30 LAB — GLUCOSE TOLERANCE, 2 HOURS W/ 1HR
Glucose, 1 hour: 119 mg/dL (ref 70–179)
Glucose, 2 hour: 103 mg/dL (ref 70–152)
Glucose, Fasting: 80 mg/dL (ref 70–91)

## 2023-07-30 LAB — RPR: RPR Ser Ql: NONREACTIVE

## 2023-08-01 ENCOUNTER — Other Ambulatory Visit: Payer: Self-pay | Admitting: Obstetrics & Gynecology

## 2023-08-01 ENCOUNTER — Encounter (HOSPITAL_COMMUNITY): Payer: Self-pay | Admitting: Obstetrics and Gynecology

## 2023-08-01 ENCOUNTER — Encounter: Payer: Self-pay | Admitting: Obstetrics & Gynecology

## 2023-08-01 ENCOUNTER — Inpatient Hospital Stay (HOSPITAL_COMMUNITY)
Admission: AD | Admit: 2023-08-01 | Discharge: 2023-08-01 | Disposition: A | Discharge status: Home / Self Care | Payer: MEDICAID | Attending: Obstetrics and Gynecology | Admitting: Obstetrics and Gynecology

## 2023-08-01 DIAGNOSIS — N390 Urinary tract infection, site not specified: Secondary | ICD-10-CM | POA: Diagnosis not present

## 2023-08-01 DIAGNOSIS — R252 Cramp and spasm: Secondary | ICD-10-CM | POA: Diagnosis present

## 2023-08-01 DIAGNOSIS — Z3A27 27 weeks gestation of pregnancy: Secondary | ICD-10-CM | POA: Insufficient documentation

## 2023-08-01 DIAGNOSIS — M545 Low back pain, unspecified: Secondary | ICD-10-CM | POA: Diagnosis present

## 2023-08-01 DIAGNOSIS — O26892 Other specified pregnancy related conditions, second trimester: Secondary | ICD-10-CM | POA: Insufficient documentation

## 2023-08-01 DIAGNOSIS — O2342 Unspecified infection of urinary tract in pregnancy, second trimester: Secondary | ICD-10-CM | POA: Insufficient documentation

## 2023-08-01 DIAGNOSIS — Z87891 Personal history of nicotine dependence: Secondary | ICD-10-CM | POA: Diagnosis present

## 2023-08-01 DIAGNOSIS — B952 Enterococcus as the cause of diseases classified elsewhere: Secondary | ICD-10-CM | POA: Diagnosis not present

## 2023-08-01 DIAGNOSIS — R000 Tachycardia, unspecified: Secondary | ICD-10-CM | POA: Insufficient documentation

## 2023-08-01 DIAGNOSIS — N3000 Acute cystitis without hematuria: Secondary | ICD-10-CM

## 2023-08-01 LAB — URINE CULTURE, OB REFLEX

## 2023-08-01 LAB — WET PREP, GENITAL
Clue Cells Wet Prep HPF POC: NONE SEEN
Sperm: NONE SEEN
Trich, Wet Prep: NONE SEEN
WBC, Wet Prep HPF POC: <10 (ref <10)
Yeast Wet Prep HPF POC: NONE SEEN

## 2023-08-01 LAB — CBC
HCT: 29.9 % — ABNORMAL LOW (ref 36.0–46.0)
Hemoglobin: 9.9 g/dL — ABNORMAL LOW (ref 12.0–15.0)
MCH: 29.2 pg (ref 26.0–34.0)
MCHC: 33.1 g/dL (ref 30.0–36.0)
MCV: 88.2 fL (ref 80.0–100.0)
Platelets: 250 10*3/uL (ref 150–400)
RBC: 3.39 MIL/uL — ABNORMAL LOW (ref 3.87–5.11)
RDW: 13.5 % (ref 11.5–15.5)
WBC: 9.6 10*3/uL (ref 4.0–10.5)
nRBC: 0 % (ref 0.0–0.2)

## 2023-08-01 LAB — URINALYSIS, ROUTINE W REFLEX MICROSCOPIC
Bilirubin Urine: NEGATIVE
Glucose, UA: NEGATIVE mg/dL
Ketones, ur: 5 mg/dL — AB
Leukocytes,Ua: NEGATIVE
Nitrite: NEGATIVE
Protein, ur: 30 mg/dL — AB
RBC / HPF: >50 RBC/hpf (ref 0–5)
Specific Gravity, Urine: 1.019 (ref 1.005–1.030)
pH: 6 (ref 5.0–8.0)

## 2023-08-01 LAB — COMPREHENSIVE METABOLIC PANEL
ALT: 17 U/L (ref 0–44)
AST: 20 U/L (ref 15–41)
Albumin: 2.6 g/dL — ABNORMAL LOW (ref 3.5–5.0)
Alkaline Phosphatase: 70 U/L (ref 38–126)
Anion gap: 15 (ref 5–15)
BUN: <5 mg/dL — ABNORMAL LOW (ref 6–20)
CO2: 23 mmol/L (ref 22–32)
Calcium: 8.9 mg/dL (ref 8.9–10.3)
Chloride: 100 mmol/L (ref 98–111)
Creatinine, Ser: 0.45 mg/dL (ref 0.44–1.00)
GFR, Estimated: >60 mL/min (ref >60)
Glucose, Bld: 80 mg/dL (ref 70–99)
Potassium: 3.1 mmol/L — ABNORMAL LOW (ref 3.5–5.1)
Sodium: 138 mmol/L (ref 135–145)
Total Bilirubin: 0.5 mg/dL (ref 0.3–1.2)
Total Protein: 6.3 g/dL — ABNORMAL LOW (ref 6.5–8.1)

## 2023-08-01 LAB — CULTURE, OB URINE

## 2023-08-01 MED ORDER — CEFADROXIL 500 MG PO CAPS
500.0000 mg | ORAL_CAPSULE | Freq: Two times a day (BID) | ORAL | 0 refills | Status: DC
Start: 2023-08-01 — End: 2023-08-01

## 2023-08-01 MED ORDER — NITROFURANTOIN MONOHYD MACRO 100 MG PO CAPS: ORAL_CAPSULE | ORAL | 0 refills | Status: AC

## 2023-08-01 MED ORDER — SODIUM CHLORIDE 0.9 % IV SOLN: 1.0000 g | INTRAVENOUS | Status: DC

## 2023-08-01 MED ORDER — SODIUM CHLORIDE 0.9 % IV SOLN
2.0000 g | Freq: Once | INTRAVENOUS | Status: AC
  Administered 2023-08-01: 2 g via INTRAVENOUS
  Filled 2023-08-01: qty 2000

## 2023-08-01 MED ORDER — NITROFURANTOIN MONOHYD MACRO 100 MG PO CAPS: ORAL_CAPSULE | ORAL | 0 refills | Status: DC

## 2023-08-01 MED ORDER — LACTATED RINGERS IV BOLUS
1000.0000 mL | Freq: Once | INTRAVENOUS | Status: AC
  Administered 2023-08-01: 1000 mL via INTRAVENOUS

## 2023-08-01 MED ORDER — SODIUM CHLORIDE 0.9 % IV SOLN
INTRAVENOUS | Status: DC | PRN
  Administered 2023-08-01: 10 mL via INTRAVENOUS

## 2023-08-01 NOTE — MAU Provider Note (Signed)
History     CSN: [id number]  Arrival date and time: 08/01/23 0940   Event Date/Time   First Provider Initiated Contact with Patient 08/01/23 1110      Chief Complaint  Patient presents with   Contractions   Back Pain   HPI Tiffany Hansen is a 17 y.o. Z3G6440 at 105w3d who presents today for pelvic pain and spotting. She reports symptoms started this morning and have been accompanied by low back pain, a cramping sensation, and pinkish vaginal discharge with wiping. She feels chills, but has not had a measured fever. She denies dysuria, leakage of fluid, or decreased fetal movement. Her pelvic cramping has been consistent -- no change in frequency or intermittent nature to resemble ctx per her account.  Past Medical History:  Diagnosis Date   Abdominal pain, recurrent    Anxiety    Constipation, chronic    Depression    History of UTI     Past Surgical History:  Procedure Laterality Date   NO PAST SURGERIES      Family History  Problem Relation Age of Onset   Celiac disease Neg Hx    Ulcers Neg Hx    Cholelithiasis Neg Hx     Social History   Tobacco Use   Smoking status: Former    Current packs/day: 0.00    Types: Cigarettes    Quit date: 03/2021    Years since quitting: 2.3   Smokeless tobacco: Never  Vaping Use   Vaping status: Former   Substances: Nicotine  Substance Use Topics   Alcohol use: No   Drug use: No    Allergies:  Allergies  Allergen Reactions   Amoxicillin Itching    Medications Prior to Admission  Medication Sig Dispense Refill Last Dose   hydrOXYzine (ATARAX) 10 MG tablet Take 1 tablet (10 mg total) by mouth every 8 (eight) hours as needed. For anxiety 30 tablet 1 Past Month   Prenatal Vit-Fe Fumarate-FA (PRENATAL VITAMIN PO) Take by mouth.   08/01/2023   sertraline (ZOLOFT) 25 MG tablet Take 1 tablet (25 mg total) by mouth daily. 30 tablet 3 08/01/2023   acetaminophen (TYLENOL) 325 MG tablet Take 2 tablets (650 mg total) by mouth every 4  (four) hours as needed (for pain scale < 4). (Patient not taking: Reported on 05/13/2023)      Blood Pressure Monitor MISC For regular home bp monitoring during pregnancy (Patient not taking: Reported on 05/13/2023) 1 each 0     Review of Systems  Constitutional:  Positive for chills. Negative for fatigue and fever.  Respiratory:  Negative for cough and shortness of breath.   Cardiovascular:  Negative for chest pain and palpitations.  Gastrointestinal:  Positive for abdominal pain and nausea. Negative for constipation, diarrhea and vomiting.  Genitourinary:  Positive for pelvic pain, vaginal bleeding and vaginal discharge. Negative for dysuria and frequency.  Musculoskeletal:  Positive for back pain.  Skin:  Negative for rash.  Neurological:  Negative for dizziness, light-headedness and headaches.   Physical Exam   Blood pressure (!) 116/51, pulse 93, temperature 98.5 F (36.9 C), resp. rate 18, height 5\' 2"  (1.575 m), weight 68 kg, last menstrual period 01/21/2023, not currently breastfeeding.  Physical Exam Constitutional:      General: She is not in acute distress.    Appearance: Normal appearance.  HENT:     Head: Normocephalic and atraumatic.  Cardiovascular:     Rate and Rhythm: Regular rhythm. Tachycardia present.  Heart sounds: Normal heart sounds.  Pulmonary:     Effort: Pulmonary effort is normal. No respiratory distress.     Breath sounds: Normal breath sounds. No wheezing.  Abdominal:     General: There is no distension.     Palpations: Abdomen is soft.     Tenderness: There is no abdominal tenderness. There is no right CVA tenderness, left CVA tenderness or guarding.  Musculoskeletal:        General: Normal range of motion.     Right lower leg: No edema.     Left lower leg: No edema.  Skin:    General: Skin is warm and dry.     Findings: No rash.  Neurological:     General: No focal deficit present.     Mental Status: She is alert and oriented to person,  place, and time.  Psychiatric:        Mood and Affect: Mood normal.        Behavior: Behavior normal.   EFM: 145/mod/+10x10 accels/no decels Toco: no ctx seen on monitor  MAU Course  Procedures  MDM 17 y.o. [id number] at [redacted]w[redacted]d who presents with abd cramping, chills, spotting. Has known history of recurrent UTI. Vital signs notable for tachycardia. Abdominal exam benign. Will obtain UA, wet prep, GC/CT, labs and reassess. Giving IV fluids as pt tachycardic, reporting ?uterine irritability.   2:02 PM Uterine irritability improving w IV fluids, UA c/f UTI, WBC wnl, CMP ok. Will give dose of IV Ampicillin (discussed with pharmacist -- growing E. Faecalis and known ASE w Amoxicillin) then discharge on Macrobid per pharmacist recs. Plan for suppression therapy with Macrobid 100mg  every day for remainder of pregnancy. Return precautions discussed with patient in detail.  Assessment and Plan  UTI (urinary tract infection) due to Enterococcus - Plan: Discharge patient  Patient discussed w Dr. Para March.  Sundra Aland 08/01/2023, 11:43 AM

## 2023-08-01 NOTE — MAU Note (Signed)
.  Triona Severin is a 17 y.o. at [redacted]w[redacted]d here in MAU reporting: since about 6am she has been having lower back pain and cramping that wraps around to her abd . Pain has become increasingly more uncomfortable. Reports some vaginal spotting when wiping. Good fetal movement reported.  LMP:  Onset of complaint: 6am Pain score: 7 Vitals:   08/01/23 1034  BP: (!) 116/51  Pulse: 93  Resp: 18  Temp: 98.5 F (36.9 C)     FHT:140 Lab orders placed from triage:  u/a

## 2023-08-01 NOTE — Progress Notes (Signed)
UTI still present of note patient currently in MICU.  Will plan for IV Rocephin as well as Duricef for 7 days  Myna Hidalgo, DO Attending Obstetrician & Gynecologist, [facility] for [employer], [facility]

## 2023-08-02 LAB — GC/CHLAMYDIA PROBE AMP (~~LOC~~) NOT AT ARMC
Chlamydia: NEGATIVE
Comment: NEGATIVE
Comment: NORMAL
Neisseria Gonorrhea: NEGATIVE

## 2023-08-04 ENCOUNTER — Encounter: Payer: Self-pay | Admitting: Obstetrics & Gynecology

## 2023-08-16 ENCOUNTER — Encounter: Payer: Self-pay | Admitting: Obstetrics & Gynecology

## 2023-08-16 MED ORDER — FERROUS GLUCONATE 324 (38 FE) MG PO TABS
324.0000 mg | ORAL_TABLET | Freq: Every day | ORAL | 3 refills | Status: DC
Start: 2023-08-16 — End: 2023-11-27

## 2023-08-20 ENCOUNTER — Encounter: Payer: Self-pay | Admitting: Women's Health

## 2023-08-20 ENCOUNTER — Ambulatory Visit (INDEPENDENT_AMBULATORY_CARE_PROVIDER_SITE_OTHER): Payer: MEDICAID | Admitting: Women's Health

## 2023-08-20 VITALS — BP 103/59 | HR 82 | Wt 150.3 lb

## 2023-08-20 DIAGNOSIS — O26843 Uterine size-date discrepancy, third trimester: Secondary | ICD-10-CM

## 2023-08-20 DIAGNOSIS — Z3A30 30 weeks gestation of pregnancy: Secondary | ICD-10-CM

## 2023-08-20 DIAGNOSIS — Z3480 Encounter for supervision of other normal pregnancy, unspecified trimester: Secondary | ICD-10-CM

## 2023-08-20 DIAGNOSIS — Z23 Encounter for immunization: Secondary | ICD-10-CM

## 2023-08-20 DIAGNOSIS — Z3483 Encounter for supervision of other normal pregnancy, third trimester: Secondary | ICD-10-CM

## 2023-08-20 DIAGNOSIS — N390 Urinary tract infection, site not specified: Secondary | ICD-10-CM

## 2023-08-20 NOTE — Patient Instructions (Signed)
Tiffany Hansen, thank you for choosing our office today! We appreciate the opportunity to meet your healthcare needs. You may receive a short survey by mail, e-mail, or through [employer]. If you are happy with your care we would appreciate if you could take just a few minutes to complete the survey questions. We read all of your comments and take your feedback very seriously. Thank you again for choosing our office.  Center for [employer] Team at [facility]  [facility] at [facility] ([address] [place], [place] [address]) Entrance C, located off of [address] Free 24/7 valet parking   CLASSES: Go to [email or web address] to register for classes (childbirth, breastfeeding, waterbirth, infant CPR, daddy bootcamp, etc.)  Call the office [phone]) or go to [facility] if: You begin to have strong, frequent contractions Your water breaks.  Sometimes it is a big gush of fluid, sometimes it is just a trickle that keeps getting your panties wet or running down your legs You have vaginal bleeding.  It is normal to have a small amount of spotting if your cervix was checked.  You don't feel your baby moving like normal.  If you don't, get you something to eat and drink and lay down and focus on feeling your baby move.   If your baby is still not moving like normal, you should call the office or go to [facility].  Call the office [phone]) or go to [facility] hospital for these signs of pre-eclampsia: Severe headache that does not go away with Tylenol Visual changes- seeing spots, double, blurred vision Pain under your right breast or upper abdomen that does not go away with Tums or heartburn medicine Nausea and/or vomiting Severe swelling in your hands, feet, and face   Tdap Vaccine It is recommended that you get the Tdap vaccine during the third trimester of EACH pregnancy to help protect your baby from getting pertussis (whooping cough) 27-36 weeks is the BEST time to do  this so that you can pass the protection on to your baby. During pregnancy is better than after pregnancy, but if you are unable to get it during pregnancy it will be offered at the hospital.  You can get this vaccine with [place], at the health department, your family doctor, or some local pharmacies Everyone who will be around your baby should also be up-to-date on their vaccines before the baby comes. Adults (who are not pregnant) only need 1 dose of Tdap during adulthood.   [facility] Pediatricians/Family Doctors [facility]): [address] Colette Ribas, [phone]           [facility]: [address], [phone]                [facility]): [address], [phone] (call to ask if accepting patients) [facility] Department: [address], [place], [phone]    [facility] Pediatricians/Family Doctors [facility]): [phone] [address], [phone] [facility]: [address] [place], [phone] [facility]: [address], [phone]  [facility] Doctors  [facility]): [phone] [facility]: [address] [phone]   [facility] Doctors [facility]: [address] [phone]  [facility] Family Doctors  [facility]: [phone], [phone]  Home Blood Pressure Monitoring for Patients   Your provider has recommended that you check your  blood pressure (BP) at least once a week at home. If you do not have a blood pressure cuff at home, one will be provided for you. Contact your provider if you have not received your monitor within 1 week.   Helpful Tips for Accurate Home Blood Pressure Checks  Don't smoke, exercise, or drink caffeine 30 minutes before checking your BP Use the restroom before checking your BP (a full bladder can raise your  pressure) Relax in a comfortable upright chair Feet on the ground Left arm resting comfortably on a flat surface at the level of your heart Legs uncrossed Back supported Sit quietly and don't talk Place the cuff on your bare arm Adjust snuggly, so that only two fingertips can fit between your skin and the top of the cuff Check 2 readings separated by at least one minute Keep a log of your BP readings For a visual, please reference this diagram: http://ccnc.care/bpdiagram  Provider Name: [facility]     Phone: [phone]  Zone 1: ALL CLEAR  Continue to monitor your symptoms:  BP reading is less than 140 (top number) or less than 90 (bottom number)  No right upper stomach pain No headaches or seeing spots No feeling nauseated or throwing up No swelling in face and hands  Zone 2: CAUTION Call your doctor's office for any of the following:  BP reading is greater than 140 (top number) or greater than 90 (bottom number)  Stomach pain under your ribs in the middle or right side Headaches or seeing spots Feeling nauseated or throwing up Swelling in face and hands  Zone 3: EMERGENCY  Seek immediate medical care if you have any of the following:  BP reading is greater than160 (top number) or greater than 110 (bottom number) Severe headaches not improving with Tylenol Serious difficulty catching your breath Any worsening symptoms from Zone 2   Third Trimester of Pregnancy The third trimester is from week 29 through week 42, months 7 through 9. The third trimester is a time when the fetus is growing rapidly. At the end of the ninth month, the fetus is about 20 inches in length and weighs 6-10 pounds.  BODY CHANGES Your body goes through many changes during pregnancy. The changes vary from woman to woman.  Your weight will continue to increase. You can expect to gain 25-35 pounds (11-16 kg) by the end of the pregnancy. You may begin to get stretch marks on your hips, abdomen,  and breasts. You may urinate more often because the fetus is moving lower into your pelvis and pressing on your bladder. You may develop or continue to have heartburn as a result of your pregnancy. You may develop constipation because certain hormones are causing the muscles that push waste through your intestines to slow down. You may develop hemorrhoids or swollen, bulging veins (varicose veins). You may have pelvic pain because of the weight gain and pregnancy hormones relaxing your joints between the bones in your pelvis. Backaches may result from overexertion of the muscles supporting your posture. You may have changes in your hair. These can include thickening of your hair, rapid growth, and changes in texture. Some women also have hair loss during or after pregnancy, or hair that feels dry or thin. Your hair will most likely return to normal after your baby is born. Your breasts will continue to grow and be tender. A yellow discharge may leak from your breasts called colostrum. Your belly button may stick out. You may  feel short of breath because of your expanding uterus. You may notice the fetus "dropping," or moving lower in your abdomen. You may have a bloody mucus discharge. This usually occurs a few days to a week before labor begins. Your cervix becomes thin and soft (effaced) near your due date. WHAT TO EXPECT AT YOUR PRENATAL EXAMS  You will have prenatal exams every 2 weeks until week 36. Then, you will have weekly prenatal exams. During a routine prenatal visit: You will be weighed to make sure you and the fetus are growing normally. Your blood pressure is taken. Your abdomen will be measured to track your baby's growth. The fetal heartbeat will be listened to. Any test results from the previous visit will be discussed. You may have a cervical check near your due date to see if you have effaced. At around 36 weeks, your caregiver will check your cervix. At the same time, your  caregiver will also perform a test on the secretions of the vaginal tissue. This test is to determine if a type of bacteria, Group B streptococcus, is present. Your caregiver will explain this further. Your caregiver may ask you: What your birth plan is. How you are feeling. If you are feeling the baby move. If you have had any abnormal symptoms, such as leaking fluid, bleeding, severe headaches, or abdominal cramping. If you have any questions. Other tests or screenings that may be performed during your third trimester include: Blood tests that check for low iron levels (anemia). Fetal testing to check the health, activity level, and growth of the fetus. Testing is done if you have certain medical conditions or if there are problems during the pregnancy. FALSE LABOR You may feel small, irregular contractions that eventually go away. These are called Braxton Hicks contractions, or false labor. Contractions may last for hours, days, or even weeks before true labor sets in. If contractions come at regular intervals, intensify, or become painful, it is best to be seen by your caregiver.  SIGNS OF LABOR  Menstrual-like cramps. Contractions that are 5 minutes apart or less. Contractions that start on the top of the uterus and spread down to the lower abdomen and back. A sense of increased pelvic pressure or back pain. A watery or bloody mucus discharge that comes from the vagina. If you have any of these signs before the 37th week of pregnancy, call your caregiver right away. You need to go to the hospital to get checked immediately. HOME CARE INSTRUCTIONS  Avoid all smoking, herbs, alcohol, and unprescribed drugs. These chemicals affect the formation and growth of the baby. Follow your caregiver's instructions regarding medicine use. There are medicines that are either safe or unsafe to take during pregnancy. Exercise only as directed by your caregiver. Experiencing uterine cramps is a good sign to  stop exercising. Continue to eat regular, healthy meals. Wear a good support bra for breast tenderness. Do not use hot tubs, steam rooms, or saunas. Wear your seat belt at all times when driving. Avoid raw meat, uncooked cheese, cat litter boxes, and soil used by cats. These carry germs that can cause birth defects in the baby. Take your prenatal vitamins. Try taking a stool softener (if your caregiver approves) if you develop constipation. Eat more high-fiber foods, such as fresh vegetables or fruit and whole grains. Drink plenty of fluids to keep your urine clear or pale yellow. Take warm sitz baths to soothe any pain or discomfort caused by hemorrhoids. Use hemorrhoid cream if  your caregiver approves. If you develop varicose veins, wear support hose. Elevate your feet for 15 minutes, 3-4 times a day. Limit salt in your diet. Avoid heavy lifting, wear low heal shoes, and practice good posture. Rest a lot with your legs elevated if you have leg cramps or low back pain. Visit your dentist if you have not gone during your pregnancy. Use a soft toothbrush to brush your teeth and be gentle when you floss. A sexual relationship may be continued unless your caregiver directs you otherwise. Do not travel far distances unless it is absolutely necessary and only with the approval of your caregiver. Take prenatal classes to understand, practice, and ask questions about the labor and delivery. Make a trial run to the hospital. Pack your hospital bag. Prepare the baby's nursery. Continue to go to all your prenatal visits as directed by your caregiver. SEEK MEDICAL CARE IF: You are unsure if you are in labor or if your water has broken. You have dizziness. You have mild pelvic cramps, pelvic pressure, or nagging pain in your abdominal area. You have persistent nausea, vomiting, or diarrhea. You have a bad smelling vaginal discharge. You have pain with urination. SEEK IMMEDIATE MEDICAL CARE IF:  You  have a fever. You are leaking fluid from your vagina. You have spotting or bleeding from your vagina. You have severe abdominal cramping or pain. You have rapid weight loss or gain. You have shortness of breath with chest pain. You notice sudden or extreme swelling of your face, hands, ankles, feet, or legs. You have not felt your baby move in over an hour. You have severe headaches that do not go away with medicine. You have vision changes. Document Released: 10/30/2001 Document Revised: 11/10/2013 Document Reviewed: 01/06/2013 [facility] Patient Information 2015 [place], [place]. This information is not intended to replace advice given to you by your health care provider. Make sure you discuss any questions you have with your health care provider.

## 2023-08-20 NOTE — Progress Notes (Signed)
LOW-RISK PREGNANCY VISIT Patient name: Tiffany Hansen MRN [id number]  Date of birth: [birth year] Chief Complaint:   Routine Prenatal Visit (Tdap)  History of Present Illness:   Tiffany Hansen is a 17 y.o. G3P0111 female at [redacted]w[redacted]d with an Estimated Date of Delivery: 10/28/23 being seen today for ongoing management of a low-risk pregnancy.   Today she reports  leaking this morning on way to shower, none since . Denies abnormal discharge, itching/odor/irritation.   Contractions: Irregular.  .  Movement: Present. denies leaking of fluid.     04/22/2023   10:10 AM 03/06/2023   12:04 PM 08/02/2022    2:31 PM 03/01/2022   11:01 AM 12/04/2021    9:29 AM  Depression screen PHQ 2/9  Decreased Interest 0 2 0 0 0  Down, Depressed, Hopeless 0 0 0 0 0  PHQ - 2 Score 0 2 0 0 0  Altered sleeping 0 0 0 0 0  Tired, decreased energy 0 1 0 3 0  Change in appetite 0 0 0 0 0  Feeling bad or failure about yourself  0 0 0 0 0  Trouble concentrating 0 1 0 0 0  Moving slowly or fidgety/restless 0 0 0 0 0  Suicidal thoughts 0 0 0 0 0  PHQ-9 Score 0 4 0 3 0        04/22/2023   10:11 AM 03/06/2023   12:06 PM 08/02/2022    2:31 PM 03/01/2022   11:02 AM  GAD 7 : Generalized Anxiety Score  Nervous, Anxious, on Edge 1 3 0 0  Control/stop worrying 0 3 0 0  Worry too much - different things 0 3 0 0  Trouble relaxing 0 1 0 0  Restless 0 0 0 0  Easily annoyed or irritable 0 0 0 0  Afraid - awful might happen 0 1 0 0  Total GAD 7 Score 1 11 0 0      Review of Systems:   Pertinent items are noted in HPI Denies abnormal vaginal discharge w/ itching/odor/irritation, headaches, visual changes, shortness of breath, chest pain, abdominal pain, severe nausea/vomiting, or problems with urination or bowel movements unless otherwise stated above. Pertinent History Reviewed:  Reviewed past medical,surgical, social, obstetrical and family history.  Reviewed problem list, medications and allergies. Physical Assessment:    Vitals:   08/20/23 0955  BP: (!) 103/59  Pulse: 82  Weight: 150 lb 4.8 oz (68.2 kg)  Body mass index is 27.49 kg/m.        Physical Examination:   General appearance: Well appearing, and in no distress  Mental status: Alert, oriented to person, place, and time  Skin: Warm & dry  Cardiovascular: Normal heart rate noted  Respiratory: Normal respiratory effort, no distress  Abdomen: Soft, gravid, nontender  Pelvic:  SSE by Everlena Cooper, SNM, no pooling, no change w/ valsalva, nitrazine neg          Extremities: Edema: None  Fetal Status: Fetal Heart Rate (bpm): 135 Fundal Height: 25 cm Movement: Present    Chaperone:  me    No results found for this or any previous visit (from the past 24 hour(s)).  Assessment & Plan:  1) Low-risk pregnancy G3P0111 at [redacted]w[redacted]d with an Estimated Date of Delivery: 10/28/23   2) Recurrent UTIs, urine cx poc today, continue macrobid at bedtime suppression  3) Vaginal d/c>no evidence of ROM  4) Uterine s<d- will get efw u/s   Meds: No orders of the defined types were  placed in this encounter.  Labs/procedures today: spec exam, tdap, and urine culture  Plan:  Continue routine obstetrical care  Next visit: prefers in person    Reviewed: Preterm labor symptoms and general obstetric precautions including but not limited to vaginal bleeding, contractions, leaking of fluid and fetal movement were reviewed in detail with the patient.  All questions were answered. Does have home bp cuff. Office bp cuff given: not applicable. Check bp weekly, let us know if consistently >140 and/or >90.  Follow-up: Return for ASAP, US:EFW (no visit), then 2wks LROB w/ CNM in person.  Future Appointments  Date Time Provider Department Center  08/21/2023 11:30 AM [facility] 2 [facility] None    Orders Placed This Encounter  Procedures   Urine Culture   US OB Follow Up   Tdap vaccine greater than or equal to 7yo IM   Cheral Marker CNM, [facility] 08/20/2023 10:49 AM

## 2023-08-21 ENCOUNTER — Other Ambulatory Visit (INDEPENDENT_AMBULATORY_CARE_PROVIDER_SITE_OTHER): Payer: MEDICAID | Admitting: Radiology

## 2023-08-21 DIAGNOSIS — O26843 Uterine size-date discrepancy, third trimester: Secondary | ICD-10-CM

## 2023-08-21 DIAGNOSIS — Z3A30 30 weeks gestation of pregnancy: Secondary | ICD-10-CM

## 2023-08-21 DIAGNOSIS — Z3480 Encounter for supervision of other normal pregnancy, unspecified trimester: Secondary | ICD-10-CM

## 2023-08-21 DIAGNOSIS — Z3483 Encounter for supervision of other normal pregnancy, third trimester: Secondary | ICD-10-CM

## 2023-08-21 NOTE — Progress Notes (Signed)
[place] 30+2 wks,breech,cx 3.6 cm,anterior placenta gr 0,FHR 150 bpm,AFI 14 cm,EFW 1557 g 40%

## 2023-08-22 ENCOUNTER — Encounter: Payer: Self-pay | Admitting: Obstetrics & Gynecology

## 2023-08-22 LAB — URINE CULTURE

## 2023-09-03 ENCOUNTER — Encounter: Payer: Self-pay | Admitting: Women's Health

## 2023-09-03 ENCOUNTER — Ambulatory Visit (INDEPENDENT_AMBULATORY_CARE_PROVIDER_SITE_OTHER): Payer: MEDICAID | Admitting: Women's Health

## 2023-09-03 VITALS — BP 114/73 | HR 93 | Wt 156.0 lb

## 2023-09-03 DIAGNOSIS — Z3483 Encounter for supervision of other normal pregnancy, third trimester: Secondary | ICD-10-CM

## 2023-09-03 DIAGNOSIS — Z3480 Encounter for supervision of other normal pregnancy, unspecified trimester: Secondary | ICD-10-CM

## 2023-09-03 NOTE — Patient Instructions (Signed)
Tiffany Hansen, thank you for choosing our office today! We appreciate the opportunity to meet your healthcare needs. You may receive a short survey by mail, e-mail, or through [employer]. If you are happy with your care we would appreciate if you could take just a few minutes to complete the survey questions. We read all of your comments and take your feedback very seriously. Thank you again for choosing our office.  Center for [employer] Team at [facility]  [facility] at [facility] ([address] [place], [place] [address]) Entrance C, located off of [address] Free 24/7 valet parking   CLASSES: Go to [email or web address] to register for classes (childbirth, breastfeeding, waterbirth, infant CPR, daddy bootcamp, etc.)  Call the office [phone]) or go to [facility] if: You begin to have strong, frequent contractions Your water breaks.  Sometimes it is a big gush of fluid, sometimes it is just a trickle that keeps getting your panties wet or running down your legs You have vaginal bleeding.  It is normal to have a small amount of spotting if your cervix was checked.  You don't feel your baby moving like normal.  If you don't, get you something to eat and drink and lay down and focus on feeling your baby move.   If your baby is still not moving like normal, you should call the office or go to [facility].  Call the office [phone]) or go to [facility] hospital for these signs of pre-eclampsia: Severe headache that does not go away with Tylenol Visual changes- seeing spots, double, blurred vision Pain under your right breast or upper abdomen that does not go away with Tums or heartburn medicine Nausea and/or vomiting Severe swelling in your hands, feet, and face   Tdap Vaccine It is recommended that you get the Tdap vaccine during the third trimester of EACH pregnancy to help protect your baby from getting pertussis (whooping cough) 27-36 weeks is the BEST time to do  this so that you can pass the protection on to your baby. During pregnancy is better than after pregnancy, but if you are unable to get it during pregnancy it will be offered at the hospital.  You can get this vaccine with [place], at the health department, your family doctor, or some local pharmacies Everyone who will be around your baby should also be up-to-date on their vaccines before the baby comes. Adults (who are not pregnant) only need 1 dose of Tdap during adulthood.   [facility] Pediatricians/Family Doctors [facility]): [address] Colette Ribas, [phone]           [facility]: [address], [phone]                [facility]): [address], [phone] (call to ask if accepting patients) [facility] Department: [address], [place], [phone]    [facility] Pediatricians/Family Doctors [facility]): [phone] [address], [phone] [facility]: [address] [place], [phone] [facility]: [address], [phone]  [facility] Doctors  [facility]): [phone] [facility]: [address] [phone]   [facility] Doctors [facility]: [address] [phone]  [facility] Family Doctors  [facility]: [phone], [phone]  Home Blood Pressure Monitoring for Patients   Your provider has recommended that you check your  blood pressure (BP) at least once a week at home. If you do not have a blood pressure cuff at home, one will be provided for you. Contact your provider if you have not received your monitor within 1 week.   Helpful Tips for Accurate Home Blood Pressure Checks  Don't smoke, exercise, or drink caffeine 30 minutes before checking your BP Use the restroom before checking your BP (a full bladder can raise your  pressure) Relax in a comfortable upright chair Feet on the ground Left arm resting comfortably on a flat surface at the level of your heart Legs uncrossed Back supported Sit quietly and don't talk Place the cuff on your bare arm Adjust snuggly, so that only two fingertips can fit between your skin and the top of the cuff Check 2 readings separated by at least one minute Keep a log of your BP readings For a visual, please reference this diagram: http://ccnc.care/bpdiagram  Provider Name: [facility]     Phone: [phone]  Zone 1: ALL CLEAR  Continue to monitor your symptoms:  BP reading is less than 140 (top number) or less than 90 (bottom number)  No right upper stomach pain No headaches or seeing spots No feeling nauseated or throwing up No swelling in face and hands  Zone 2: CAUTION Call your doctor's office for any of the following:  BP reading is greater than 140 (top number) or greater than 90 (bottom number)  Stomach pain under your ribs in the middle or right side Headaches or seeing spots Feeling nauseated or throwing up Swelling in face and hands  Zone 3: EMERGENCY  Seek immediate medical care if you have any of the following:  BP reading is greater than160 (top number) or greater than 110 (bottom number) Severe headaches not improving with Tylenol Serious difficulty catching your breath Any worsening symptoms from Zone 2  Preterm Labor and Birth Information  The normal length of a pregnancy is 39-41 weeks. Preterm labor is when labor starts before 37 completed weeks of pregnancy. What are the risk factors for preterm labor? Preterm labor is more likely to occur in women who: Have certain infections during pregnancy such as a bladder infection, sexually transmitted infection, or infection inside the uterus (chorioamnionitis). Have a shorter-than-normal cervix. Have gone into preterm labor before. Have had surgery on their cervix. Are younger than age 54  or older than age 2. Are African American. Are pregnant with twins or multiple babies (multiple gestation). Take street drugs or smoke while pregnant. Do not gain enough weight while pregnant. Became pregnant shortly after having been pregnant. What are the symptoms of preterm labor? Symptoms of preterm labor include: Cramps similar to those that can happen during a menstrual period. The cramps may happen with diarrhea. Pain in the abdomen or lower back. Regular uterine contractions that may feel like tightening of the abdomen. A feeling of increased pressure in the pelvis. Increased watery or bloody mucus discharge from the vagina. Water breaking (ruptured amniotic sac). Why is it important to recognize signs of preterm labor? It is important to recognize signs of preterm labor because babies who are born prematurely may not be fully developed. This can put them at an increased risk for: Long-term (chronic) heart and lung problems. Difficulty immediately after birth with regulating body systems, including blood sugar, body temperature, heart rate, and breathing rate. Bleeding in the brain. Cerebral palsy. Learning difficulties. Death. These risks are highest for babies who are born before 34 weeks  of pregnancy. How is preterm labor treated? Treatment depends on the length of your pregnancy, your condition, and the health of your baby. It may involve: Having a stitch (suture) placed in your cervix to prevent your cervix from opening too early (cerclage). Taking or being given medicines, such as: Hormone medicines. These may be given early in pregnancy to help support the pregnancy. Medicine to stop contractions. Medicines to help mature the baby's lungs. These may be prescribed if the risk of delivery is high. Medicines to prevent your baby from developing cerebral palsy. If the labor happens before 34 weeks of pregnancy, you may need to stay in the hospital. What should I do if I  think I am in preterm labor? If you think that you are going into preterm labor, call your health care provider right away. How can I prevent preterm labor in future pregnancies? To increase your chance of having a full-term pregnancy: Do not use any tobacco products, such as cigarettes, chewing tobacco, and e-cigarettes. If you need help quitting, ask your health care provider. Do not use street drugs or medicines that have not been prescribed to you during your pregnancy. Talk with your health care provider before taking any herbal supplements, even if you have been taking them regularly. Make sure you gain a healthy amount of weight during your pregnancy. Watch for infection. If you think that you might have an infection, get it checked right away. Make sure to tell your health care provider if you have gone into preterm labor before. This information is not intended to replace advice given to you by your health care provider. Make sure you discuss any questions you have with your health care provider. Document Revised: 02/27/2019 Document Reviewed: 03/28/2016 Elsevier Patient Education  2020 [employer].

## 2023-09-03 NOTE — Progress Notes (Signed)
LOW-RISK PREGNANCY VISIT Patient name: Tiffany Hansen MRN [id number]  Date of birth: [birth year] Chief Complaint:   Routine Prenatal Visit  History of Present Illness:   Tiffany Hansen is a 17 y.o. G3P0111 female at [redacted]w[redacted]d with an Estimated Date of Delivery: 10/28/23 being seen today for ongoing management of a low-risk pregnancy.   Today she reports no complaints. Contractions: Irritability.  .  Movement: Present. denies leaking of fluid.     04/22/2023   10:10 AM 03/06/2023   12:04 PM 08/02/2022    2:31 PM 03/01/2022   11:01 AM 12/04/2021    9:29 AM  Depression screen PHQ 2/9  Decreased Interest 0 2 0 0 0  Down, Depressed, Hopeless 0 0 0 0 0  PHQ - 2 Score 0 2 0 0 0  Altered sleeping 0 0 0 0 0  Tired, decreased energy 0 1 0 3 0  Change in appetite 0 0 0 0 0  Feeling bad or failure about yourself  0 0 0 0 0  Trouble concentrating 0 1 0 0 0  Moving slowly or fidgety/restless 0 0 0 0 0  Suicidal thoughts 0 0 0 0 0  PHQ-9 Score 0 4 0 3 0        04/22/2023   10:11 AM 03/06/2023   12:06 PM 08/02/2022    2:31 PM 03/01/2022   11:02 AM  GAD 7 : Generalized Anxiety Score  Nervous, Anxious, on Edge 1 3 0 0  Control/stop worrying 0 3 0 0  Worry too much - different things 0 3 0 0  Trouble relaxing 0 1 0 0  Restless 0 0 0 0  Easily annoyed or irritable 0 0 0 0  Afraid - awful might happen 0 1 0 0  Total GAD 7 Score 1 11 0 0      Review of Systems:   Pertinent items are noted in HPI Denies abnormal vaginal discharge w/ itching/odor/irritation, headaches, visual changes, shortness of breath, chest pain, abdominal pain, severe nausea/vomiting, or problems with urination or bowel movements unless otherwise stated above. Pertinent History Reviewed:  Reviewed past medical,surgical, social, obstetrical and family history.  Reviewed problem list, medications and allergies. Physical Assessment:   Vitals:   09/03/23 0837  BP: 114/73  Pulse: 93  Weight: 156 lb (70.8 kg)  Body mass index is  28.53 kg/m.        Physical Examination:   General appearance: Well appearing, and in no distress  Mental status: Alert, oriented to person, place, and time  Skin: Warm & dry  Cardiovascular: Normal heart rate noted  Respiratory: Normal respiratory effort, no distress  Abdomen: Soft, gravid, nontender  Pelvic: Cervical exam deferred         Extremities: Edema: None  Fetal Status: Fetal Heart Rate (bpm): 133 Fundal Height: 27 cm Movement: Present    Chaperone: N/A   No results found for this or any previous visit (from the past 24 hour(s)).  Assessment & Plan:  1) Low-risk pregnancy G3P0111 at [redacted]w[redacted]d with an Estimated Date of Delivery: 10/28/23   2) H/O 35wk PTB, reviewed ptl s/s   Meds: No orders of the defined types were placed in this encounter.  Labs/procedures today: none  Plan:  Continue routine obstetrical care  Next visit: prefers in person    Reviewed: Preterm labor symptoms and general obstetric precautions including but not limited to vaginal bleeding, contractions, leaking of fluid and fetal movement were reviewed in detail with the patient.  All questions were answered.   Follow-up: Return in about 2 weeks (around 09/17/2023) for LROB, CNM, in person.  No future appointments.  No orders of the defined types were placed in this encounter.  Cheral Marker CNM, [facility] 09/03/2023 9:02 AM

## 2023-09-04 ENCOUNTER — Inpatient Hospital Stay (HOSPITAL_COMMUNITY)
Admission: AD | Admit: 2023-09-04 | Discharge: 2023-09-04 | Disposition: A | Discharge status: Home / Self Care | Payer: MEDICAID | Attending: Obstetrics and Gynecology | Admitting: Obstetrics and Gynecology

## 2023-09-04 ENCOUNTER — Encounter (HOSPITAL_COMMUNITY): Payer: Self-pay | Admitting: Obstetrics and Gynecology

## 2023-09-04 DIAGNOSIS — R109 Unspecified abdominal pain: Secondary | ICD-10-CM | POA: Diagnosis not present

## 2023-09-04 DIAGNOSIS — O26853 Spotting complicating pregnancy, third trimester: Secondary | ICD-10-CM | POA: Insufficient documentation

## 2023-09-04 DIAGNOSIS — N939 Abnormal uterine and vaginal bleeding, unspecified: Secondary | ICD-10-CM

## 2023-09-04 DIAGNOSIS — Z3A32 32 weeks gestation of pregnancy: Secondary | ICD-10-CM | POA: Insufficient documentation

## 2023-09-04 DIAGNOSIS — O26893 Other specified pregnancy related conditions, third trimester: Secondary | ICD-10-CM | POA: Diagnosis not present

## 2023-09-04 DIAGNOSIS — O09213 Supervision of pregnancy with history of pre-term labor, third trimester: Secondary | ICD-10-CM | POA: Insufficient documentation

## 2023-09-04 DIAGNOSIS — Z8751 Personal history of pre-term labor: Secondary | ICD-10-CM

## 2023-09-04 LAB — WET PREP, GENITAL
Clue Cells Wet Prep HPF POC: NONE SEEN
Sperm: NONE SEEN
Trich, Wet Prep: NONE SEEN
WBC, Wet Prep HPF POC: <10 (ref <10)
Yeast Wet Prep HPF POC: NONE SEEN

## 2023-09-04 LAB — URINALYSIS, ROUTINE W REFLEX MICROSCOPIC
Bilirubin Urine: NEGATIVE
Glucose, UA: NEGATIVE mg/dL
Hgb urine dipstick: NEGATIVE
Ketones, ur: 5 mg/dL — AB
Leukocytes,Ua: NEGATIVE
Nitrite: NEGATIVE
Protein, ur: NEGATIVE mg/dL
Specific Gravity, Urine: 1.011 (ref 1.005–1.030)
pH: 7 (ref 5.0–8.0)

## 2023-09-04 NOTE — MAU Provider Note (Signed)
Chief Complaint:  Contractions and Vaginal Bleeding   Event Date/Time   First Provider Initiated Contact with Patient 09/04/23 2248      HPI: Tiffany Hansen is a 17 y.o. G9F6213 at [redacted]w[redacted]d  who presents to maternity admissions reporting cramping with vaginal pressure today at work and pink spotting when wiping x 1. Pain resolved without treatment.  She was recently on Macrobid for UTI but denies any dysuria or vaginal symptoms. She reports good fetal movement.   HPI  Past Medical History: Past Medical History:  Diagnosis Date   Abdominal pain, recurrent    Anxiety    Constipation, chronic    Depression    History of UTI     Past obstetric history: OB History  Gravida Para Term Preterm AB Living  3 1 0 1 1 1   SAB IAB Ectopic Multiple Live Births  1 0 0 0 1    # Outcome Date GA Lbr Len/2nd Weight Sex Type Anes PTL Lv  3 Current           2 Preterm 12/12/21 [redacted]w[redacted]d 07:59 / 00:36 2730 g M Vag-Spont EPI  LIV     Complications: Intraamniotic Infection  1 SAB             Past Surgical History: Past Surgical History:  Procedure Laterality Date   NO PAST SURGERIES      Family History: Family History  Problem Relation Age of Onset   Celiac disease Neg Hx    Ulcers Neg Hx    Cholelithiasis Neg Hx     Social History: Social History   Tobacco Use   Smoking status: Former    Current packs/day: 0.00    Types: Cigarettes    Quit date: 03/2021    Years since quitting: 2.4   Smokeless tobacco: Never  Vaping Use   Vaping status: Former   Substances: Nicotine  Substance Use Topics   Alcohol use: No   Drug use: No    Allergies:  Allergies  Allergen Reactions   Amoxicillin Itching    Meds:  Medications Prior to Admission  Medication Sig Dispense Refill Last Dose   hydrOXYzine (ATARAX) 10 MG tablet Take 1 tablet (10 mg total) by mouth every 8 (eight) hours as needed. For anxiety 30 tablet 1 09/04/2023   Prenatal Vit-Fe Fumarate-FA (PRENATAL VITAMIN PO) Take by mouth.    09/04/2023   sertraline (ZOLOFT) 25 MG tablet Take 1 tablet (25 mg total) by mouth daily. 30 tablet 3 09/04/2023   acetaminophen (TYLENOL) 325 MG tablet Take 2 tablets (650 mg total) by mouth every 4 (four) hours as needed (for pain scale < 4). (Patient not taking: Reported on 05/13/2023)      Blood Pressure Monitor MISC For regular home bp monitoring during pregnancy (Patient not taking: Reported on 05/13/2023) 1 each 0    ferrous gluconate (FERGON) 324 MG tablet Take 1 tablet (324 mg total) by mouth daily with breakfast. (Patient not taking: Reported on 08/20/2023) 30 tablet 3     ROS:  Review of Systems   I have reviewed patient's Past Medical Hx, Surgical Hx, Family Hx, Social Hx, medications and allergies.   Physical Exam  Patient Vitals for the past 24 hrs:  BP Temp Temp src Pulse Resp SpO2 Height  09/04/23 2316 115/71 -- -- (!) 101 -- -- --  09/04/23 2305 -- -- -- -- -- 99 % --  09/04/23 2242 -- -- -- -- -- 100 % --  09/04/23 2230 -- -- -- -- --  100 % --  09/04/23 2150 -- -- -- -- -- 100 % --  09/04/23 2135 -- -- -- -- -- 100 % --  09/04/23 2132 (!) 116/59 -- -- (!) 101 -- -- --  09/04/23 2012 121/67 97.6 F (36.4 C) Oral (!) 115 19 100 % 5\' 2"  (1.575 m)   Constitutional: Well-developed, well-nourished female in no acute distress.  Cardiovascular: normal rate Respiratory: normal effort GI: Abd soft, non-tender, gravid appropriate for gestational age.  MS: Extremities nontender, no edema, normal ROM Neurologic: Alert and oriented x 4.  GU: Neg CVAT.  PELVIC EXAM: Cervix pink, visually closed, without lesion, scant white creamy discharge, vaginal walls and external genitalia normal Bimanual exam: Cervix 0/long/high, firm, anterior, neg CMT, uterus nontender, nonenlarged, adnexa without tenderness, enlargement, or mass  Dilation: 1 Effacement (%): Thick Cervical Position: Posterior Exam by:: Sharen Counter, CNM  FHT:  Baseline 135 , moderate variability, accelerations  present, no decelerations Contractions: none on toco or to palpation   Labs: Results for orders placed or performed during the hospital encounter of 09/04/23 (from the past 24 hour(s))  Urinalysis, Routine w reflex microscopic -Urine, Clean Catch     Status: Abnormal   Collection Time: 09/04/23  8:20 PM  Result Value Ref Range   Color, Urine YELLOW YELLOW   APPearance CLEAR CLEAR   Specific Gravity, Urine 1.011 1.005 - 1.030   pH 7.0 5.0 - 8.0   Glucose, UA NEGATIVE NEGATIVE mg/dL   Hgb urine dipstick NEGATIVE NEGATIVE   Bilirubin Urine NEGATIVE NEGATIVE   Ketones, ur 5 (A) NEGATIVE mg/dL   Protein, ur NEGATIVE NEGATIVE mg/dL   Nitrite NEGATIVE NEGATIVE   Leukocytes,Ua NEGATIVE NEGATIVE   A/Positive/-- (06/03 1041)  Imaging:   MAU Course/MDM: Orders Placed This Encounter  Procedures   Wet prep, genital   Urinalysis, Routine w reflex microscopic -Urine, Clean Catch   Discharge patient    No orders of the defined types were placed in this encounter.    NST reviewed and reactive Cervix 1/long/ballottable, posterior, no evidence of active preterm labor PTL and bleeding precautions given Wet prep/GCC pending Pt desires d/c from MAU prior to results, so will notify patient if treatment is needed. Keep scheduled appts at [facility]   Assessment: 1. Vaginal spotting   2. Abdominal pain during pregnancy in third trimester   3. [redacted] weeks gestation of pregnancy   4. History of preterm delivery     Plan: Discharge home Labor precautions and fetal kick counts  Allergies as of 09/04/2023       Reactions   Amoxicillin Itching        Medication List     TAKE these medications    acetaminophen 325 MG tablet Commonly known as: Tylenol Take 2 tablets (650 mg total) by mouth every 4 (four) hours as needed (for pain scale < 4).   Blood Pressure Monitor Misc For regular home bp monitoring during pregnancy   ferrous gluconate 324 MG tablet Commonly known as:  FERGON Take 1 tablet (324 mg total) by mouth daily with breakfast.   hydrOXYzine 10 MG tablet Commonly known as: ATARAX Take 1 tablet (10 mg total) by mouth every 8 (eight) hours as needed. For anxiety   PRENATAL VITAMIN PO Take by mouth.   sertraline 25 MG tablet Commonly known as: Zoloft Take 1 tablet (25 mg total) by mouth daily.        Sharen Counter Certified Nurse-Midwife 09/04/2023 11:30 PM

## 2023-09-04 NOTE — MAU Note (Signed)
..  Tiffany Hansen is a 17 y.o. at [redacted]w[redacted]d here in MAU reporting: An hour ago she had cramping every 10 minutes and sharp feeling in vagina when she walked and had some spotting when she wiped.  None of these symptoms now.  +FM .  Pain score: 0/10 Vitals:   09/04/23 2012  BP: 121/67  Pulse: (!) 115  Resp: 19  Temp: 97.6 F (36.4 C)  SpO2: 100%     FHT:134 Lab orders placed from triage:  UA

## 2023-09-05 ENCOUNTER — Encounter: Payer: Self-pay | Admitting: Obstetrics & Gynecology

## 2023-09-05 LAB — GC/CHLAMYDIA PROBE AMP (~~LOC~~) NOT AT ARMC
Chlamydia: NEGATIVE
Comment: NEGATIVE
Comment: NORMAL
Neisseria Gonorrhea: NEGATIVE

## 2023-09-11 ENCOUNTER — Encounter: Payer: Self-pay | Admitting: Women's Health

## 2023-09-16 ENCOUNTER — Encounter: Payer: Self-pay | Admitting: Advanced Practice Midwife

## 2023-09-16 ENCOUNTER — Ambulatory Visit (INDEPENDENT_AMBULATORY_CARE_PROVIDER_SITE_OTHER): Payer: MEDICAID | Admitting: Women's Health

## 2023-09-16 ENCOUNTER — Encounter: Payer: Self-pay | Admitting: Women's Health

## 2023-09-16 ENCOUNTER — Other Ambulatory Visit (HOSPITAL_COMMUNITY)
Admission: RE | Admit: 2023-09-16 | Discharge: 2023-09-16 | Disposition: A | Discharge status: Home / Self Care | Payer: MEDICAID | Source: Ambulatory Visit | Attending: Women's Health | Admitting: Women's Health

## 2023-09-16 VITALS — BP 130/74 | HR 105 | Wt 159.0 lb

## 2023-09-16 DIAGNOSIS — N898 Other specified noninflammatory disorders of vagina: Secondary | ICD-10-CM

## 2023-09-16 DIAGNOSIS — Z3483 Encounter for supervision of other normal pregnancy, third trimester: Secondary | ICD-10-CM

## 2023-09-16 DIAGNOSIS — O26893 Other specified pregnancy related conditions, third trimester: Secondary | ICD-10-CM | POA: Diagnosis present

## 2023-09-16 DIAGNOSIS — Z3A34 34 weeks gestation of pregnancy: Secondary | ICD-10-CM

## 2023-09-16 NOTE — Progress Notes (Signed)
LOW-RISK PREGNANCY VISIT Patient name: Tiffany Hansen MRN [id number]  Date of birth: 16-Dec-2005 Chief Complaint:   Routine Prenatal Visit  History of Present Illness:   Tiffany Hansen is a 17 y.o. G3P0111 female at [redacted]w[redacted]d with an Estimated Date of Delivery: 10/28/23 being seen today for ongoing management of a low-risk pregnancy.   Today she reports  cramping, but stomach doesn't get tight. Some mucousy d/c like mucous plug . Denies abnormal discharge, itching/odor/irritation. Neg wet prep & gc/ct on MAU visit 10/16-wants CV swab redone. Wants cx checked.  Contractions: Irritability. Vag. Bleeding: None.  Movement: Present. denies leaking of fluid.     04/22/2023   10:10 AM 03/06/2023   12:04 PM 08/02/2022    2:31 PM 03/01/2022   11:01 AM 12/04/2021    9:29 AM  Depression screen PHQ 2/9  Decreased Interest 0 2 0 0 0  Down, Depressed, Hopeless 0 0 0 0 0  PHQ - 2 Score 0 2 0 0 0  Altered sleeping 0 0 0 0 0  Tired, decreased energy 0 1 0 3 0  Change in appetite 0 0 0 0 0  Feeling bad or failure about yourself  0 0 0 0 0  Trouble concentrating 0 1 0 0 0  Moving slowly or fidgety/restless 0 0 0 0 0  Suicidal thoughts 0 0 0 0 0  PHQ-9 Score 0 4 0 3 0        04/22/2023   10:11 AM 03/06/2023   12:06 PM 08/02/2022    2:31 PM 03/01/2022   11:02 AM  GAD 7 : Generalized Anxiety Score  Nervous, Anxious, on Edge 1 3 0 0  Control/stop worrying 0 3 0 0  Worry too much - different things 0 3 0 0  Trouble relaxing 0 1 0 0  Restless 0 0 0 0  Easily annoyed or irritable 0 0 0 0  Afraid - awful might happen 0 1 0 0  Total GAD 7 Score 1 11 0 0      Review of Systems:   Pertinent items are noted in HPI Denies abnormal vaginal discharge w/ itching/odor/irritation, headaches, visual changes, shortness of breath, chest pain, abdominal pain, severe nausea/vomiting, or problems with urination or bowel movements unless otherwise stated above. Pertinent History Reviewed:  Reviewed past medical,surgical,  social, obstetrical and family history.  Reviewed problem list, medications and allergies. Physical Assessment:   Vitals:   09/16/23 1135  BP: 130/74  Pulse: (!) 105  Weight: 159 lb (72.1 kg)  Body mass index is 29.08 kg/m.        Physical Examination:   General appearance: Well appearing, and in no distress  Mental status: Alert, oriented to person, place, and time  Skin: Warm & dry  Cardiovascular: Normal heart rate noted  Respiratory: Normal respiratory effort, no distress  Abdomen: Soft, gravid, nontender  Pelvic: Cervical exam performed  Dilation: 1 Effacement (%): Thick Station: -3  Extremities:    Fetal Status: Fetal Heart Rate (bpm): 143 Fundal Height: 30 cm Movement: Present Presentation: Vertex  Chaperone: Faith Rogue   No results found for this or any previous visit (from the past 24 hour(s)).  Assessment & Plan:  1) Low-risk pregnancy G3P0111 at [redacted]w[redacted]d with an Estimated Date of Delivery: 10/28/23   2) Size <dates, EFW 40% at 30wks  3) H/O 35wk PTB> no cervical change since last check  4) Cramping/vag discharge> CV swab, reviewed PTL s/s, reasons to seek care   Meds:  No orders of the defined types were placed in this encounter.  Labs/procedures today: spec exam, CV swab, and SVE  Plan:  Continue routine obstetrical care  Next visit: prefers will be in person for GBS     Reviewed: Preterm labor symptoms and general obstetric precautions including but not limited to vaginal bleeding, contractions, leaking of fluid and fetal movement were reviewed in detail with the patient.  All questions were answered. Does have home bp cuff. Office bp cuff given: not applicable. Check bp weekly, let us know if consistently >140 and/or >90.  Follow-up: Return in about 2 weeks (around 09/30/2023) for LROB, CNM, in person, then weekly thereafter.  No future appointments.  No orders of the defined types were placed in this encounter.  Cheral Marker CNM,  [facility] 09/16/2023 11:49 AM

## 2023-09-16 NOTE — Patient Instructions (Signed)
Tiffany Hansen, thank you for choosing our office today! We appreciate the opportunity to meet your healthcare needs. You may receive a short survey by mail, e-mail, or through [employer]. If you are happy with your care we would appreciate if you could take just a few minutes to complete the survey questions. We read all of your comments and take your feedback very seriously. Thank you again for choosing our office.  Center for [employer] Team at [facility]  [facility] at [facility] ([address] [place], [place] [address]) Entrance C, located off of [address] Free 24/7 valet parking   CLASSES: Go to [email or web address] to register for classes (childbirth, breastfeeding, waterbirth, infant CPR, daddy bootcamp, etc.)  Call the office [phone]) or go to [facility] if: You begin to have strong, frequent contractions Your water breaks.  Sometimes it is a big gush of fluid, sometimes it is just a trickle that keeps getting your panties wet or running down your legs You have vaginal bleeding.  It is normal to have a small amount of spotting if your cervix was checked.  You don't feel your baby moving like normal.  If you don't, get you something to eat and drink and lay down and focus on feeling your baby move.   If your baby is still not moving like normal, you should call the office or go to [facility].  Call the office [phone]) or go to [facility] hospital for these signs of pre-eclampsia: Severe headache that does not go away with Tylenol Visual changes- seeing spots, double, blurred vision Pain under your right breast or upper abdomen that does not go away with Tums or heartburn medicine Nausea and/or vomiting Severe swelling in your hands, feet, and face   Tdap Vaccine It is recommended that you get the Tdap vaccine during the third trimester of EACH pregnancy to help protect your baby from getting pertussis (whooping cough) 27-36 weeks is the BEST time to do  this so that you can pass the protection on to your baby. During pregnancy is better than after pregnancy, but if you are unable to get it during pregnancy it will be offered at the hospital.  You can get this vaccine with [place], at the health department, your family doctor, or some local pharmacies Everyone who will be around your baby should also be up-to-date on their vaccines before the baby comes. Adults (who are not pregnant) only need 1 dose of Tdap during adulthood.   [facility] Pediatricians/Family Doctors [facility]): [address] Colette Ribas, [phone]           [facility]: [address], [phone]                [facility]): [address], [phone] (call to ask if accepting patients) [facility] Department: [address], [place], [phone]    [facility] Pediatricians/Family Doctors [facility]): [phone] [address], [phone] [facility]: [address] [place], [phone] [facility]: [address], [phone]  [facility] Doctors  [facility]): [phone] [facility]: [address] [phone]   [facility] Doctors [facility]: [address] [phone]  [facility] Family Doctors  [facility]: [phone], [phone]  Home Blood Pressure Monitoring for Patients   Your provider has recommended that you check your  blood pressure (BP) at least once a week at home. If you do not have a blood pressure cuff at home, one will be provided for you. Contact your provider if you have not received your monitor within 1 week.   Helpful Tips for Accurate Home Blood Pressure Checks  Don't smoke, exercise, or drink caffeine 30 minutes before checking your BP Use the restroom before checking your BP (a full bladder can raise your  pressure) Relax in a comfortable upright chair Feet on the ground Left arm resting comfortably on a flat surface at the level of your heart Legs uncrossed Back supported Sit quietly and don't talk Place the cuff on your bare arm Adjust snuggly, so that only two fingertips can fit between your skin and the top of the cuff Check 2 readings separated by at least one minute Keep a log of your BP readings For a visual, please reference this diagram: http://ccnc.care/bpdiagram  Provider Name: [facility]     Phone: [phone]  Zone 1: ALL CLEAR  Continue to monitor your symptoms:  BP reading is less than 140 (top number) or less than 90 (bottom number)  No right upper stomach pain No headaches or seeing spots No feeling nauseated or throwing up No swelling in face and hands  Zone 2: CAUTION Call your doctor's office for any of the following:  BP reading is greater than 140 (top number) or greater than 90 (bottom number)  Stomach pain under your ribs in the middle or right side Headaches or seeing spots Feeling nauseated or throwing up Swelling in face and hands  Zone 3: EMERGENCY  Seek immediate medical care if you have any of the following:  BP reading is greater than160 (top number) or greater than 110 (bottom number) Severe headaches not improving with Tylenol Serious difficulty catching your breath Any worsening symptoms from Zone 2  Preterm Labor and Birth Information  The normal length of a pregnancy is 39-41 weeks. Preterm labor is when labor starts before 37 completed weeks of pregnancy. What are the risk factors for preterm labor? Preterm labor is more likely to occur in women who: Have certain infections during pregnancy such as a bladder infection, sexually transmitted infection, or infection inside the uterus (chorioamnionitis). Have a shorter-than-normal cervix. Have gone into preterm labor before. Have had surgery on their cervix. Are younger than age 54  or older than age 2. Are African American. Are pregnant with twins or multiple babies (multiple gestation). Take street drugs or smoke while pregnant. Do not gain enough weight while pregnant. Became pregnant shortly after having been pregnant. What are the symptoms of preterm labor? Symptoms of preterm labor include: Cramps similar to those that can happen during a menstrual period. The cramps may happen with diarrhea. Pain in the abdomen or lower back. Regular uterine contractions that may feel like tightening of the abdomen. A feeling of increased pressure in the pelvis. Increased watery or bloody mucus discharge from the vagina. Water breaking (ruptured amniotic sac). Why is it important to recognize signs of preterm labor? It is important to recognize signs of preterm labor because babies who are born prematurely may not be fully developed. This can put them at an increased risk for: Long-term (chronic) heart and lung problems. Difficulty immediately after birth with regulating body systems, including blood sugar, body temperature, heart rate, and breathing rate. Bleeding in the brain. Cerebral palsy. Learning difficulties. Death. These risks are highest for babies who are born before 34 weeks  of pregnancy. How is preterm labor treated? Treatment depends on the length of your pregnancy, your condition, and the health of your baby. It may involve: Having a stitch (suture) placed in your cervix to prevent your cervix from opening too early (cerclage). Taking or being given medicines, such as: Hormone medicines. These may be given early in pregnancy to help support the pregnancy. Medicine to stop contractions. Medicines to help mature the baby's lungs. These may be prescribed if the risk of delivery is high. Medicines to prevent your baby from developing cerebral palsy. If the labor happens before 34 weeks of pregnancy, you may need to stay in the hospital. What should I do if I  think I am in preterm labor? If you think that you are going into preterm labor, call your health care provider right away. How can I prevent preterm labor in future pregnancies? To increase your chance of having a full-term pregnancy: Do not use any tobacco products, such as cigarettes, chewing tobacco, and e-cigarettes. If you need help quitting, ask your health care provider. Do not use street drugs or medicines that have not been prescribed to you during your pregnancy. Talk with your health care provider before taking any herbal supplements, even if you have been taking them regularly. Make sure you gain a healthy amount of weight during your pregnancy. Watch for infection. If you think that you might have an infection, get it checked right away. Make sure to tell your health care provider if you have gone into preterm labor before. This information is not intended to replace advice given to you by your health care provider. Make sure you discuss any questions you have with your health care provider. Document Revised: 02/27/2019 Document Reviewed: 03/28/2016 Elsevier Patient Education  2020 [employer].

## 2023-09-17 LAB — CERVICOVAGINAL ANCILLARY ONLY
Bacterial Vaginitis (gardnerella): NEGATIVE
Candida Glabrata: NEGATIVE
Candida Vaginitis: NEGATIVE
Chlamydia: NEGATIVE
Comment: NEGATIVE
Comment: NEGATIVE
Comment: NEGATIVE
Comment: NEGATIVE
Comment: NEGATIVE
Comment: NORMAL
Neisseria Gonorrhea: NEGATIVE
Trichomonas: NEGATIVE

## 2023-09-23 ENCOUNTER — Other Ambulatory Visit (INDEPENDENT_AMBULATORY_CARE_PROVIDER_SITE_OTHER): Payer: MEDICAID

## 2023-09-23 ENCOUNTER — Encounter: Payer: Self-pay | Admitting: Advanced Practice Midwife

## 2023-09-23 VITALS — BP 122/71 | HR 119 | Wt 160.2 lb

## 2023-09-23 DIAGNOSIS — R3915 Urgency of urination: Secondary | ICD-10-CM

## 2023-09-23 DIAGNOSIS — Z3A35 35 weeks gestation of pregnancy: Secondary | ICD-10-CM

## 2023-09-23 DIAGNOSIS — Z3480 Encounter for supervision of other normal pregnancy, unspecified trimester: Secondary | ICD-10-CM

## 2023-09-23 DIAGNOSIS — R350 Frequency of micturition: Secondary | ICD-10-CM

## 2023-09-23 DIAGNOSIS — Z3483 Encounter for supervision of other normal pregnancy, third trimester: Secondary | ICD-10-CM

## 2023-09-23 LAB — POCT URINALYSIS DIPSTICK OB
Blood, UA: NEGATIVE
Glucose, UA: NEGATIVE
Leukocytes, UA: NEGATIVE
Nitrite, UA: NEGATIVE

## 2023-09-23 MED ORDER — NITROFURANTOIN MONOHYD MACRO 100 MG PO CAPS: 100.0000 mg | ORAL_CAPSULE | Freq: Two times a day (BID) | ORAL | 0 refills | Status: DC

## 2023-09-23 NOTE — Addendum Note (Signed)
Addended by: Cheral Marker on: 09/23/2023 04:41 PM   Modules accepted: Orders

## 2023-09-23 NOTE — Progress Notes (Addendum)
   NURSE VISIT- UTI SYMPTOMS   SUBJECTIVE:  Tiffany Hansen is a 17 y.o. [phone] female here for UTI symptoms. She is [redacted]w[redacted]d pregnant. She reports urinary frequency and urinary urgency.  OBJECTIVE:  BP 122/71   Pulse (!) 119   Wt 160 lb 3.2 oz (72.7 kg)   LMP 01/21/2023   BMI 29.30 kg/m   Appears well, in no apparent distress  Results for orders placed or performed in visit on 09/23/23 (from the past 24 hour(s))  POC Urinalysis Dipstick OB   Collection Time: 09/23/23  3:57 PM  Result Value Ref Range   Color, UA     Clarity, UA     Glucose, UA Negative Negative   Bilirubin, UA     Ketones, UA 1+    Spec Grav, UA     Blood, UA Negative    pH, UA     POC,PROTEIN,UA Small (1+) Negative, Trace, Small (1+), Moderate (2+), Large (3+), 4+   Urobilinogen, UA     Nitrite, UA Negative    Leukocytes, UA Negative Negative   Appearance     Odor      ASSESSMENT: Pregnancy [redacted]w[redacted]d with UTI symptoms and negative nitrites  PLAN: Discussed with Joellyn Haff, CNM, [facility]   Rx sent by provider today: Yes Urine culture sent Call or return to clinic prn if these symptoms worsen or fail to improve as anticipated. Follow-up: as scheduled   Caralyn Guile  09/23/2023 4:34 PM   Chart reviewed for nurse visit. Agree with plan of care. Rx macrobid, send urine cx. Cheral Marker, [place] 09/23/2023 4:41 PM

## 2023-09-26 ENCOUNTER — Encounter: Payer: Self-pay | Admitting: Women's Health

## 2023-09-26 ENCOUNTER — Other Ambulatory Visit: Payer: Self-pay | Admitting: Women's Health

## 2023-09-26 DIAGNOSIS — O99891 Other specified diseases and conditions complicating pregnancy: Secondary | ICD-10-CM

## 2023-09-26 LAB — URINE CULTURE

## 2023-09-26 MED ORDER — CEPHALEXIN 500 MG PO CAPS: 500.0000 mg | ORAL_CAPSULE | Freq: Every day | ORAL | 0 refills | Status: DC

## 2023-09-30 ENCOUNTER — Other Ambulatory Visit (HOSPITAL_COMMUNITY)
Admission: RE | Admit: 2023-09-30 | Discharge: 2023-09-30 | Disposition: A | Discharge status: Home / Self Care | Payer: MEDICAID | Source: Ambulatory Visit | Attending: Advanced Practice Midwife | Admitting: Advanced Practice Midwife

## 2023-09-30 ENCOUNTER — Encounter: Payer: Self-pay | Admitting: Advanced Practice Midwife

## 2023-09-30 ENCOUNTER — Ambulatory Visit (INDEPENDENT_AMBULATORY_CARE_PROVIDER_SITE_OTHER): Payer: MEDICAID | Admitting: Advanced Practice Midwife

## 2023-09-30 VITALS — BP 113/77 | HR 112 | Wt 158.0 lb

## 2023-09-30 DIAGNOSIS — Z3483 Encounter for supervision of other normal pregnancy, third trimester: Secondary | ICD-10-CM | POA: Diagnosis present

## 2023-09-30 DIAGNOSIS — Z3A36 36 weeks gestation of pregnancy: Secondary | ICD-10-CM | POA: Insufficient documentation

## 2023-09-30 DIAGNOSIS — Z3480 Encounter for supervision of other normal pregnancy, unspecified trimester: Secondary | ICD-10-CM

## 2023-09-30 DIAGNOSIS — O26843 Uterine size-date discrepancy, third trimester: Secondary | ICD-10-CM

## 2023-09-30 MED ORDER — LIDOCAINE VISCOUS HCL 2 % MT SOLN: 5.0000 mL | OROMUCOSAL | 0 refills | Status: DC | PRN

## 2023-09-30 NOTE — Progress Notes (Signed)
   LOW-RISK PREGNANCY VISIT Patient name: Tiffany Hansen MRN [id number]  Date of birth: March 03, 2006 Chief Complaint:   Routine Prenatal Visit (Chest pain comes and goes, lower back pain)  History of Present Illness:   Tiffany Hansen is a 17 y.o. [phone] female at [redacted]w[redacted]d with an Estimated Date of Delivery: 10/28/23 being seen today for ongoing management of a low-risk pregnancy.  Today she reports some chest pain (longstanding, has had evaluations and deemed not to be cardiac--could be GI), LBP, Pelvic pain.. Contractions: Irregular. Vag. Bleeding: None.  Movement: Present. denies leaking of fluid. Review of Systems:   Pertinent items are noted in HPI Denies abnormal vaginal discharge w/ itching/odor/irritation, headaches, visual changes, shortness of breath, chest pain, abdominal pain, severe nausea/vomiting, or problems with urination or bowel movements unless otherwise stated above. Pertinent History Reviewed:  Reviewed past medical,surgical, social, obstetrical and family history.  Reviewed problem list, medications and allergies. Physical Assessment:   Vitals:   09/30/23 1336  BP: 113/77  Pulse: (!) 112  Weight: 158 lb (71.7 kg)  Body mass index is 28.9 kg/m.        Physical Examination:   General appearance: Well appearing, and in no distress  Mental status: Alert, oriented to person, place, and time  Skin: Warm & dry  Cardiovascular: Normal heart rate noted  Respiratory: Normal respiratory effort, no distress  Abdomen: Soft, gravid, nontender  Pelvic: Cervical exam deferred  Dilation: Fingertip Effacement (%): Thick Station: -2  Extremities: Edema: None Chaperone:  Latisha Cresenzo   Fetal Status: Fetal Heart Rate (bpm): 148 Fundal Height: 31 cm Movement: Present Presentation: Vertex    No results found for this or any previous visit (from the past 24 hour(s)).  Assessment & Plan:    Pregnancy: [id number] at [redacted]w[redacted]d 1. Supervision of other normal pregnancy, antepartum   2. [redacted] weeks  gestation of pregnancy  3.  ? Reflux:  rx  4. Size<dates:  efw 40% at 30 weeks; recheck Meds:  Meds ordered this encounter  Medications   lidocaine (XYLOCAINE) 2 % solution    Sig: Use as directed 5 mLs in the mouth or throat as needed for mouth pain.    Dispense:  100 mL    Refill:  0    Order Specific Question:   Supervising Provider    Answer:   Lazaro Arms [[id number]]   Labs/procedures today: see above  Plan:  Continue routine obstetrical care  Next visit: prefers in person    Reviewed: Term labor symptoms and general obstetric precautions including but not limited to vaginal bleeding, contractions, leaking of fluid and fetal movement were reviewed in detail with the patient.  All questions were answered. Has home bp cuff. Check bp weekly, let us know if >140/90.   Follow-up: Return for add an EFW/AFI [place].  Future Appointments  Date Time Provider Department Center  10/08/2023 11:50 AM Cheral Marker, CNM [facility]  10/14/2023  1:30 PM Cheral Marker, CNM [facility]  10/21/2023  1:30 PM Cheral Marker, CNM [facility]  10/28/2023  1:30 PM Cheral Marker, CNM [facility]  10/28/2023  1:30 PM [facility] NURSE [facility]    Orders Placed This Encounter  Procedures   Strep Gp B NAA+Rflx   US OB Follow Up   Jacklyn Shell DNP, CNM 09/30/2023 2:00 PM

## 2023-09-30 NOTE — Patient Instructions (Signed)

## 2023-10-02 LAB — CERVICOVAGINAL ANCILLARY ONLY
Chlamydia: NEGATIVE
Comment: NEGATIVE
Comment: NORMAL
Neisseria Gonorrhea: NEGATIVE

## 2023-10-02 LAB — STREP GP B NAA+RFLX: Strep Gp B NAA+Rflx: NEGATIVE

## 2023-10-04 ENCOUNTER — Encounter: Payer: Self-pay | Admitting: Advanced Practice Midwife

## 2023-10-07 ENCOUNTER — Ambulatory Visit (INDEPENDENT_AMBULATORY_CARE_PROVIDER_SITE_OTHER): Payer: MEDICAID | Admitting: Women's Health

## 2023-10-07 ENCOUNTER — Ambulatory Visit: Payer: MEDICAID

## 2023-10-07 ENCOUNTER — Encounter: Payer: Self-pay | Admitting: Women's Health

## 2023-10-07 VITALS — BP 112/76 | HR 96 | Wt 163.2 lb

## 2023-10-07 DIAGNOSIS — Z3480 Encounter for supervision of other normal pregnancy, unspecified trimester: Secondary | ICD-10-CM

## 2023-10-07 DIAGNOSIS — Z3A37 37 weeks gestation of pregnancy: Secondary | ICD-10-CM

## 2023-10-07 DIAGNOSIS — R350 Frequency of micturition: Secondary | ICD-10-CM

## 2023-10-07 DIAGNOSIS — O26843 Uterine size-date discrepancy, third trimester: Secondary | ICD-10-CM | POA: Diagnosis not present

## 2023-10-07 DIAGNOSIS — Z3483 Encounter for supervision of other normal pregnancy, third trimester: Secondary | ICD-10-CM

## 2023-10-07 LAB — POCT URINALYSIS DIPSTICK OB
Blood, UA: NEGATIVE
Glucose, UA: NEGATIVE
Ketones, UA: NEGATIVE
Nitrite, UA: NEGATIVE

## 2023-10-07 MED ORDER — CEPHALEXIN 500 MG PO CAPS: 500.0000 mg | ORAL_CAPSULE | Freq: Four times a day (QID) | ORAL | 0 refills | Status: DC

## 2023-10-07 NOTE — Patient Instructions (Signed)
By[place], thank you for choosing our office today! We appreciate the opportunity to meet your healthcare needs. You may receive a short survey by mail, e-mail, or through [employer]. If you are happy with your care we would appreciate if you could take just a few minutes to complete the survey questions. We read all of your comments and take your feedback very seriously. Thank you again for choosing our office.  Center for [employer] Team at [facility]  [facility] at [facility] ([address] [place], [place] [address]) Entrance C, located off of [address] Free 24/7 valet parking   CLASSES: Go to [email or web address] to register for classes (childbirth, breastfeeding, waterbirth, infant CPR, daddy bootcamp, etc.)  Call the office [phone]) or go to [place] [facility] if: You begin to have strong, frequent contractions Your water breaks.  Sometimes it is a big gush of fluid, sometimes it is just a trickle that keeps getting your panties wet or running down your legs You have vaginal bleeding.  It is normal to have a small amount of spotting if your cervix was checked.  You don't feel your baby moving like normal.  If you don't, get you something to eat and drink and lay down and focus on feeling your baby move.   If your baby is still not moving like normal, you should call the office or go to [facility].  Call the office [phone]) or go to [facility] hospital for these signs of pre-eclampsia: Severe headache that does not go away with Tylenol Visual changes- seeing spots, double, blurred vision Pain under your right breast or upper abdomen that does not go away with Tums or heartburn medicine Nausea and/or vomiting Severe swelling in your hands, feet, and face   [facility] Pediatricians/Family Doctors [facility]): [address] Colette Ribas, [phone]           [facility]: [address], [phone]                 [facility]): [address], [phone] (call to ask if accepting patients) [facility] Department: [address], [place], [phone]    [facility] Pediatricians/Family Doctors [facility]): [address], [phone] [facility]: [address] [place], [phone] [facility]: [address], [phone]  [facility] Doctors  [facility]): [phone] [facility]: [address] [phone]   [facility] Doctors [facility]: [address], [phone]  [facility] Doctors  [facility]: [phone], [phone]  Home Blood Pressure Monitoring for Patients   Your provider has recommended that you check your blood pressure (BP) at least once a week at home. If you do not have a blood pressure cuff at home, one will be provided for you. Contact your provider if you have not received your monitor within 1 week.   Helpful Tips for Accurate Home Blood Pressure Checks  Don't smoke, exercise, or drink caffeine 30 minutes before checking your BP Use the restroom before checking your BP (a full bladder can raise your pressure) Relax in a comfortable upright chair Feet on the ground Left arm resting comfortably on a flat surface at the level of your heart Legs uncrossed Back supported Sit quietly and don't talk Place the cuff on your bare arm Adjust snuggly, so that only two fingertips  can fit between your skin and the top of the cuff Check 2 readings separated by at least one minute Keep a log of your BP readings For a visual, please reference this diagram: http://ccnc.care/bpdiagram  Provider Name: [facility]     Phone: [phone]  Zone 1: ALL CLEAR  Continue to monitor your symptoms:  BP reading is less than 140 (top number) or less than 90 (bottom number)  No right  upper stomach pain No headaches or seeing spots No feeling nauseated or throwing up No swelling in face and hands  Zone 2: CAUTION Call your doctor's office for any of the following:  BP reading is greater than 140 (top number) or greater than 90 (bottom number)  Stomach pain under your ribs in the middle or right side Headaches or seeing spots Feeling nauseated or throwing up Swelling in face and hands  Zone 3: EMERGENCY  Seek immediate medical care if you have any of the following:  BP reading is greater than160 (top number) or greater than 110 (bottom number) Severe headaches not improving with Tylenol Serious difficulty catching your breath Any worsening symptoms from Zone 2   Braxton Hicks Contractions Contractions of the uterus can occur throughout pregnancy, but they are not always a sign that you are in labor. You may have practice contractions called Braxton Hicks contractions. These false labor contractions are sometimes confused with true labor. What are Deberah Pelton contractions? Braxton Hicks contractions are tightening movements that occur in the muscles of the uterus before labor. Unlike true labor contractions, these contractions do not result in opening (dilation) and thinning of the cervix. Toward the end of pregnancy (32-34 weeks), Braxton Hicks contractions can happen more often and may become stronger. These contractions are sometimes difficult to tell apart from true labor because they can be very uncomfortable. You should not feel embarrassed if you go to the hospital with false labor. Sometimes, the only way to tell if you are in true labor is for your health care provider to look for changes in the cervix. The health care provider will do a physical exam and may monitor your contractions. If you are not in true labor, the exam should show that your cervix is not dilating and your water has not broken. If there are no other health problems associated with your  pregnancy, it is completely safe for you to be sent home with false labor. You may continue to have Braxton Hicks contractions until you go into true labor. How to tell the difference between true labor and false labor True labor Contractions last 30-70 seconds. Contractions become very regular. Discomfort is usually felt in the top of the uterus, and it spreads to the lower abdomen and low back. Contractions do not go away with walking. Contractions usually become more intense and increase in frequency. The cervix dilates and gets thinner. False labor Contractions are usually shorter and not as strong as true labor contractions. Contractions are usually irregular. Contractions are often felt in the front of the lower abdomen and in the groin. Contractions may go away when you walk around or change positions while lying down. Contractions get weaker and are shorter-lasting as time goes on. The cervix usually does not dilate or become thin. Follow these instructions at home:  Take over-the-counter and prescription medicines only as told by your health care provider. Keep up with your usual exercises and follow other instructions from your health care provider. Eat and drink lightly if you think  you are going into labor. If Braxton Hicks contractions are making you uncomfortable: Change your position from lying down or resting to walking, or change from walking to resting. Sit and rest in a tub of warm water. Drink enough fluid to keep your urine pale yellow. Dehydration may cause these contractions. Do slow and deep breathing several times an hour. Keep all follow-up prenatal visits as told by your health care provider. This is important. Contact a health care provider if: You have a fever. You have continuous pain in your abdomen. Get help right away if: Your contractions become stronger, more regular, and closer together. You have fluid leaking or gushing from your vagina. You pass  blood-tinged mucus (bloody show). You have bleeding from your vagina. You have low back pain that you never had before. You feel your baby's head pushing down and causing pelvic pressure. Your baby is not moving inside you as much as it used to. Summary Contractions that occur before labor are called Braxton Hicks contractions, false labor, or practice contractions. Braxton Hicks contractions are usually shorter, weaker, farther apart, and less regular than true labor contractions. True labor contractions usually become progressively stronger and regular, and they become more frequent. Manage discomfort from [facility] contractions by changing position, resting in a warm bath, drinking plenty of water, or practicing deep breathing. This information is not intended to replace advice given to you by your health care provider. Make sure you discuss any questions you have with your health care provider. Document Revised: 10/18/2017 Document Reviewed: 03/21/2017 Elsevier Patient Education  2020 [employer].

## 2023-10-07 NOTE — Progress Notes (Signed)
[place] 37 wks,cephalic,anterior placenta gr 2,AFI 20 cm,FHR 138 bpm,EFW 3257 g 72%

## 2023-10-07 NOTE — Progress Notes (Signed)
LOW-RISK PREGNANCY VISIT Patient name: Tiffany Hansen MRN [id number]  Date of birth: 30-Dec-2005 Chief Complaint:   Routine Prenatal Visit ([place] today!!)  History of Present Illness:   Tiffany Hansen is a 17 y.o. [phone] female at [redacted]w[redacted]d with an Estimated Date of Delivery: 10/28/23 being seen today for ongoing management of a low-risk pregnancy.   Today she reports  urinary frequency, thinks she has another UTI, never started keflex qhs . Contractions: Irritability. Vag. Bleeding: None.  Movement: Present. denies leaking of fluid.     04/22/2023   10:10 AM 03/06/2023   12:04 PM 08/02/2022    2:31 PM 03/01/2022   11:01 AM 12/04/2021    9:29 AM  Depression screen PHQ 2/9  Decreased Interest 0 2 0 0 0  Down, Depressed, Hopeless 0 0 0 0 0  PHQ - 2 Score 0 2 0 0 0  Altered sleeping 0 0 0 0 0  Tired, decreased energy 0 1 0 3 0  Change in appetite 0 0 0 0 0  Feeling bad or failure about yourself  0 0 0 0 0  Trouble concentrating 0 1 0 0 0  Moving slowly or fidgety/restless 0 0 0 0 0  Suicidal thoughts 0 0 0 0 0  PHQ-9 Score 0 4 0 3 0        04/22/2023   10:11 AM 03/06/2023   12:06 PM 08/02/2022    2:31 PM 03/01/2022   11:02 AM  GAD 7 : Generalized Anxiety Score  Nervous, Anxious, on Edge 1 3 0 0  Control/stop worrying 0 3 0 0  Worry too much - different things 0 3 0 0  Trouble relaxing 0 1 0 0  Restless 0 0 0 0  Easily annoyed or irritable 0 0 0 0  Afraid - awful might happen 0 1 0 0  Total GAD 7 Score 1 11 0 0      Review of Systems:   Pertinent items are noted in HPI Denies abnormal vaginal discharge w/ itching/odor/irritation, headaches, visual changes, shortness of breath, chest pain, abdominal pain, severe nausea/vomiting, or problems with urination or bowel movements unless otherwise stated above. Pertinent History Reviewed:  Reviewed past medical,surgical, social, obstetrical and family history.  Reviewed problem list, medications and allergies. Physical Assessment:   Vitals:    10/07/23 1512  BP: 112/76  Pulse: 96  Weight: 163 lb 3.2 oz (74 kg)  Body mass index is 29.85 kg/m.        Physical Examination:   General appearance: Well appearing, and in no distress  Mental status: Alert, oriented to person, place, and time  Skin: Warm & dry  Cardiovascular: Normal heart rate noted  Respiratory: Normal respiratory effort, no distress  Abdomen: Soft, gravid, nontender  Pelvic: Cervical exam performed  Dilation: 1.5 Effacement (%): 50 Station: -2  Extremities: Edema: None  Fetal Status: Fetal Heart Rate (bpm): +u/s   Movement: Present Presentation: Vertex [place] 37 wks,cephalic,anterior placenta gr 2,AFI 20 cm,FHR 138 bpm,EFW 3257 g 72%   Chaperone: Malachy Mood   Results for orders placed or performed in visit on 10/07/23 (from the past 24 hour(s))  POC Urinalysis Dipstick OB   Collection Time: 10/07/23  3:15 PM  Result Value Ref Range   Color, UA     Clarity, UA     Glucose, UA Negative Negative   Bilirubin, UA     Ketones, UA neg    Spec Grav, UA     Blood, UA  neg    pH, UA     POC,PROTEIN,UA Trace Negative, Trace, Small (1+), Moderate (2+), Large (3+), 4+   Urobilinogen, UA     Nitrite, UA neg    Leukocytes, UA Trace (A) Negative   Appearance     Odor      Assessment & Plan:  1) Low-risk pregnancy [id number] at [redacted]w[redacted]d with an Estimated Date of Delivery: 10/28/23   2) Recurrent UTI, sx now (did not start keflex at bedtime suppression), rx keflex QID x 7d, then start at bedtime suppression, send cx  3) EFW today for s<d 72%   Meds:  Meds ordered this encounter  Medications   cephALEXin (KEFLEX) 500 MG capsule    Sig: Take 1 capsule (500 mg total) by mouth 4 (four) times daily. X 7 days    Dispense:  28 capsule    Refill:  0   Labs/procedures today: SVE, U/S, and urine culture, declined flu shot  Plan:  Continue routine obstetrical care  Next visit: prefers in person    Reviewed: Term labor symptoms and general obstetric precautions including but  not limited to vaginal bleeding, contractions, leaking of fluid and fetal movement were reviewed in detail with the patient.  All questions were answered. Does have home bp cuff. Office bp cuff given: not applicable. Check bp weekly, let us know if consistently >140 and/or >90.  Follow-up: Return for As scheduled.  Future Appointments  Date Time Provider Department Center  10/14/2023  1:30 PM Cheral Marker, [place] [facility]  10/21/2023  1:30 PM Cheral Marker, CNM [facility]  10/28/2023  1:30 PM Cheral Marker, CNM [facility]  10/28/2023  1:30 PM [facility] NURSE [facility]    Orders Placed This Encounter  Procedures   Urine Culture   POC Urinalysis Dipstick OB   Cheral Marker CNM, [facility] 10/07/2023 3:30 PM

## 2023-10-08 ENCOUNTER — Encounter: Payer: MEDICAID | Admitting: Women's Health

## 2023-10-09 ENCOUNTER — Encounter: Payer: Self-pay | Admitting: Advanced Practice Midwife

## 2023-10-10 ENCOUNTER — Encounter: Payer: Self-pay | Admitting: Women's Health

## 2023-10-10 ENCOUNTER — Encounter: Payer: Self-pay | Admitting: Advanced Practice Midwife

## 2023-10-10 LAB — URINE CULTURE

## 2023-10-14 ENCOUNTER — Ambulatory Visit (INDEPENDENT_AMBULATORY_CARE_PROVIDER_SITE_OTHER): Payer: MEDICAID | Admitting: Women's Health

## 2023-10-14 ENCOUNTER — Encounter: Payer: Self-pay | Admitting: Women's Health

## 2023-10-14 VITALS — BP 127/72 | HR 109 | Wt 165.0 lb

## 2023-10-14 DIAGNOSIS — Z3483 Encounter for supervision of other normal pregnancy, third trimester: Secondary | ICD-10-CM

## 2023-10-14 NOTE — Patient Instructions (Signed)
Collyn, thank you for choosing our office today! We appreciate the opportunity to meet your healthcare needs. You may receive a short survey by mail, e-mail, or through [employer]. If you are happy with your care we would appreciate if you could take just a few minutes to complete the survey questions. We read all of your comments and take your feedback very seriously. Thank you again for choosing our office.  Center for [employer] Team at [facility]  [facility] at [facility] ([address] [place], [place] [address]) Entrance C, located off of [address] Free 24/7 valet parking   CLASSES: Go to [email or web address] to register for classes (childbirth, breastfeeding, waterbirth, infant CPR, daddy bootcamp, etc.)  Call the office [phone]) or go to [facility] if: You begin to have strong, frequent contractions Your water breaks.  Sometimes it is a big gush of fluid, sometimes it is just a trickle that keeps getting your panties wet or running down your legs You have vaginal bleeding.  It is normal to have a small amount of spotting if your cervix was checked.  You don't feel your baby moving like normal.  If you don't, get you something to eat and drink and lay down and focus on feeling your baby move.   If your baby is still not moving like normal, you should call the office or go to [facility].  Call the office [phone]) or go to [facility] hospital for these signs of pre-eclampsia: Severe headache that does not go away with Tylenol Visual changes- seeing spots, double, blurred vision Pain under your right breast or upper abdomen that does not go away with Tums or heartburn medicine Nausea and/or vomiting Severe swelling in your hands, feet, and face   [facility] Pediatricians/Family Doctors [facility]): [address] Colette Ribas, [phone]           [facility]: [address], [phone]                 [facility] [place]): [address], [phone] (call to ask if accepting patients) [facility] Department: [address] [place], [phone]    [facility] Pediatricians/Family Doctors [facility]): [address], [phone] [facility]: [address] [place], [phone] [facility]: [address], [phone]  [facility] Doctors  [facility]): [phone] [facility]: [address] [phone]   [facility] Doctors [facility]: [address], [phone]  [facility] Doctors  [facility]: [phone], [phone]  Home Blood Pressure Monitoring for Patients   Your provider has recommended that you check your blood pressure (BP) at least once a week at home. If you do not have a blood pressure cuff at home, one will be provided for you. Contact your provider if you have not received your monitor within 1 week.   Helpful Tips for Accurate Home Blood Pressure Checks  Don't smoke, exercise, or drink caffeine 30 minutes before checking your BP Use the restroom before checking your BP (a full bladder can raise your pressure) Relax in a comfortable upright chair Feet on the ground Left arm resting comfortably on a flat surface at the level of your heart Legs uncrossed Back supported Sit quietly and don't talk Place the cuff on your bare arm Adjust snuggly, so that only two fingertips  can fit between your skin and the top of the cuff Check 2 readings separated by at least one minute Keep a log of your BP readings For a visual, please reference this diagram: http://ccnc.care/bpdiagram  Provider Name: [facility]     Phone: [phone]  Zone 1: ALL CLEAR  Continue to monitor your symptoms:  BP reading is less than 140 (top number) or less than 90 (bottom number)  No right  upper stomach pain No headaches or seeing spots No feeling nauseated or throwing up No swelling in face and hands  Zone 2: CAUTION Call your doctor's office for any of the following:  BP reading is greater than 140 (top number) or greater than 90 (bottom number)  Stomach pain under your ribs in the middle or right side Headaches or seeing spots Feeling nauseated or throwing up Swelling in face and hands  Zone 3: EMERGENCY  Seek immediate medical care if you have any of the following:  BP reading is greater than160 (top number) or greater than 110 (bottom number) Severe headaches not improving with Tylenol Serious difficulty catching your breath Any worsening symptoms from Zone 2   Braxton Hicks Contractions Contractions of the uterus can occur throughout pregnancy, but they are not always a sign that you are in labor. You may have practice contractions called Braxton Hicks contractions. These false labor contractions are sometimes confused with true labor. What are Deberah Pelton contractions? Braxton Hicks contractions are tightening movements that occur in the muscles of the uterus before labor. Unlike true labor contractions, these contractions do not result in opening (dilation) and thinning of the cervix. Toward the end of pregnancy (32-34 weeks), Braxton Hicks contractions can happen more often and may become stronger. These contractions are sometimes difficult to tell apart from true labor because they can be very uncomfortable. You should not feel embarrassed if you go to the hospital with false labor. Sometimes, the only way to tell if you are in true labor is for your health care provider to look for changes in the cervix. The health care provider will do a physical exam and may monitor your contractions. If you are not in true labor, the exam should show that your cervix is not dilating and your water has not broken. If there are no other health problems associated with your  pregnancy, it is completely safe for you to be sent home with false labor. You may continue to have Braxton Hicks contractions until you go into true labor. How to tell the difference between true labor and false labor True labor Contractions last 30-70 seconds. Contractions become very regular. Discomfort is usually felt in the top of the uterus, and it spreads to the lower abdomen and low back. Contractions do not go away with walking. Contractions usually become more intense and increase in frequency. The cervix dilates and gets thinner. False labor Contractions are usually shorter and not as strong as true labor contractions. Contractions are usually irregular. Contractions are often felt in the front of the lower abdomen and in the groin. Contractions may go away when you walk around or change positions while lying down. Contractions get weaker and are shorter-lasting as time goes on. The cervix usually does not dilate or become thin. Follow these instructions at home:  Take over-the-counter and prescription medicines only as told by your health care provider. Keep up with your usual exercises and follow other instructions from your health care provider. Eat and drink lightly if you think  you are going into labor. If Braxton Hicks contractions are making you uncomfortable: Change your position from lying down or resting to walking, or change from walking to resting. Sit and rest in a tub of warm water. Drink enough fluid to keep your urine pale yellow. Dehydration may cause these contractions. Do slow and deep breathing several times an hour. Keep all follow-up prenatal visits as told by your health care provider. This is important. Contact a health care provider if: You have a fever. You have continuous pain in your abdomen. Get help right away if: Your contractions become stronger, more regular, and closer together. You have fluid leaking or gushing from your vagina. You pass  blood-tinged mucus (bloody show). You have bleeding from your vagina. You have low back pain that you never had before. You feel your baby's head pushing down and causing pelvic pressure. Your baby is not moving inside you as much as it used to. Summary Contractions that occur before labor are called Braxton Hicks contractions, false labor, or practice contractions. Braxton Hicks contractions are usually shorter, weaker, farther apart, and less regular than true labor contractions. True labor contractions usually become progressively stronger and regular, and they become more frequent. Manage discomfort from [facility] contractions by changing position, resting in a warm bath, drinking plenty of water, or practicing deep breathing. This information is not intended to replace advice given to you by your health care provider. Make sure you discuss any questions you have with your health care provider. Document Revised: 10/18/2017 Document Reviewed: 03/21/2017 Elsevier Patient Education  2020 [employer].

## 2023-10-14 NOTE — Progress Notes (Signed)
LOW-RISK PREGNANCY VISIT Patient name: Tiffany Hansen MRN [id number]  Date of birth: Jun 18, 2006 Chief Complaint:   Routine Prenatal Visit  History of Present Illness:   Tiffany Hansen is a 17 y.o. G3P0111 female at [redacted]w[redacted]d with an Estimated Date of Delivery: 10/28/23 being seen today for ongoing management of a low-risk pregnancy.   Today she reports no complaints. Contractions: Not present. Vag. Bleeding: None.  Movement: Present. denies leaking of fluid.     04/22/2023   10:10 AM 03/06/2023   12:04 PM 08/02/2022    2:31 PM 03/01/2022   11:01 AM 12/04/2021    9:29 AM  Depression screen PHQ 2/9  Decreased Interest 0 2 0 0 0  Down, Depressed, Hopeless 0 0 0 0 0  PHQ - 2 Score 0 2 0 0 0  Altered sleeping 0 0 0 0 0  Tired, decreased energy 0 1 0 3 0  Change in appetite 0 0 0 0 0  Feeling bad or failure about yourself  0 0 0 0 0  Trouble concentrating 0 1 0 0 0  Moving slowly or fidgety/restless 0 0 0 0 0  Suicidal thoughts 0 0 0 0 0  PHQ-9 Score 0 4 0 3 0        04/22/2023   10:11 AM 03/06/2023   12:06 PM 08/02/2022    2:31 PM 03/01/2022   11:02 AM  GAD 7 : Generalized Anxiety Score  Nervous, Anxious, on Edge 1 3 0 0  Control/stop worrying 0 3 0 0  Worry too much - different things 0 3 0 0  Trouble relaxing 0 1 0 0  Restless 0 0 0 0  Easily annoyed or irritable 0 0 0 0  Afraid - awful might happen 0 1 0 0  Total GAD 7 Score 1 11 0 0      Review of Systems:   Pertinent items are noted in HPI Denies abnormal vaginal discharge w/ itching/odor/irritation, headaches, visual changes, shortness of breath, chest pain, abdominal pain, severe nausea/vomiting, or problems with urination or bowel movements unless otherwise stated above. Pertinent History Reviewed:  Reviewed past medical,surgical, social, obstetrical and family history.  Reviewed problem list, medications and allergies. Physical Assessment:   Vitals:   10/14/23 1343  BP: 127/72  Pulse: (!) 109  Weight: 165 lb (74.8 kg)   Body mass index is 30.18 kg/m.        Physical Examination:   General appearance: Well appearing, and in no distress  Mental status: Alert, oriented to person, place, and time  Skin: Warm & dry  Cardiovascular: Normal heart rate noted  Respiratory: Normal respiratory effort, no distress  Abdomen: Soft, gravid, nontender  Pelvic: Cervical exam performed  Dilation: 2.5 Effacement (%): 80 Station: -2  Extremities: Edema: None  Fetal Status: Fetal Heart Rate (bpm): 148 Fundal Height: 34 cm Movement: Present Presentation: Vertex  Chaperone: Sherri Kaywood   No results found for this or any previous visit (from the past 24 hour(s)).  Assessment & Plan:  1) Low-risk pregnancy G3P0111 at [redacted]w[redacted]d with an Estimated Date of Delivery: 10/28/23   2) Recurrent UTIs, on keflex at bedtime  3) Size <dates- efw 72% last week   Meds: No orders of the defined types were placed in this encounter.  Labs/procedures today: SVE  Plan:  Continue routine obstetrical care  Next visit: prefers in person    Reviewed: Term labor symptoms and general obstetric precautions including but not limited to vaginal bleeding, contractions,  leaking of fluid and fetal movement were reviewed in detail with the patient.  All questions were answered. Does have home bp cuff. Office bp cuff given: not applicable. Check bp weekly, let us know if consistently >140 and/or >90.  Follow-up: Return for As scheduled.  Future Appointments  Date Time Provider Department Center  10/21/2023  1:30 PM Cheral Marker, [place] [facility]  10/28/2023  1:30 PM Cheral Marker, CNM [facility]  10/28/2023  1:30 PM [facility] NURSE [facility]    No orders of the defined types were placed in this encounter.  Cheral Marker CNM, [facility] 10/14/2023 1:56 PM

## 2023-10-15 ENCOUNTER — Encounter: Payer: Self-pay | Admitting: Women's Health

## 2023-10-15 ENCOUNTER — Other Ambulatory Visit: Payer: Self-pay | Admitting: Women's Health

## 2023-10-15 MED ORDER — CEPHALEXIN 500 MG PO CAPS: 500.0000 mg | ORAL_CAPSULE | Freq: Every day | ORAL | 0 refills | Status: DC

## 2023-10-16 ENCOUNTER — Inpatient Hospital Stay (HOSPITAL_COMMUNITY)
Admission: AD | Admit: 2023-10-16 | Discharge: 2023-10-17 | Disposition: A | Discharge status: Home / Self Care | Payer: MEDICAID | Attending: Obstetrics & Gynecology | Admitting: Obstetrics & Gynecology

## 2023-10-16 ENCOUNTER — Encounter (HOSPITAL_COMMUNITY): Payer: Self-pay | Admitting: Obstetrics & Gynecology

## 2023-10-16 DIAGNOSIS — R102 Pelvic and perineal pain: Secondary | ICD-10-CM | POA: Diagnosis not present

## 2023-10-16 DIAGNOSIS — O368130 Decreased fetal movements, third trimester, not applicable or unspecified: Secondary | ICD-10-CM | POA: Diagnosis present

## 2023-10-16 DIAGNOSIS — Z3A38 38 weeks gestation of pregnancy: Secondary | ICD-10-CM | POA: Diagnosis not present

## 2023-10-16 DIAGNOSIS — O26893 Other specified pregnancy related conditions, third trimester: Secondary | ICD-10-CM | POA: Diagnosis not present

## 2023-10-16 DIAGNOSIS — Z3689 Encounter for other specified antenatal screening: Secondary | ICD-10-CM | POA: Diagnosis not present

## 2023-10-16 LAB — URINALYSIS, ROUTINE W REFLEX MICROSCOPIC
Bilirubin Urine: NEGATIVE
Glucose, UA: NEGATIVE mg/dL
Hgb urine dipstick: NEGATIVE
Ketones, ur: NEGATIVE mg/dL
Leukocytes,Ua: NEGATIVE
Nitrite: NEGATIVE
Protein, ur: 30 mg/dL — AB
Specific Gravity, Urine: 1.017 (ref 1.005–1.030)
pH: 6 (ref 5.0–8.0)

## 2023-10-16 NOTE — MAU Note (Signed)
.  Tiffany Hansen is a 17 y.o. at [redacted]w[redacted]d here in MAU reporting: urge to push. Patient pulled straight to room 30.   Patietn reports when ambulating feeling a lot of pelvic pressure and lightning crotch. Patient reports this has been going on since 1900. Called 24 hr OB office and was told to come in and get checked out   Denies contractions, denies LOF and reports less fetal movement. 2 movements in past hr   Patient was checked on Monday in office and was 2.5 cm    Onset of complaint: 1900 Pain score: 0/10 There were no vitals filed for this visit.   LOV:FIEPPI straight to room 30  Lab orders placed from triage:

## 2023-10-16 NOTE — MAU Provider Note (Signed)
  History     CSN: [id number]  Arrival date and time: 10/16/23 2249   Event Date/Time   First Provider Initiated Contact with Patient 10/16/23 2348      Chief Complaint  Patient presents with   Decreased Fetal Movement   Pelvic Pain   HPI  {GYN/OB [id number]}  Past Medical History:  Diagnosis Date   Abdominal pain, recurrent    Anxiety    Constipation, chronic    Depression    History of UTI     Past Surgical History:  Procedure Laterality Date   NO PAST SURGERIES      Family History  Problem Relation Age of Onset   Celiac disease Neg Hx    Ulcers Neg Hx    Cholelithiasis Neg Hx     Social History   Tobacco Use   Smoking status: Former    Current packs/day: 0.00    Types: Cigarettes    Quit date: 03/2021    Years since quitting: 2.5   Smokeless tobacco: Never  Vaping Use   Vaping status: Former   Substances: Nicotine  Substance Use Topics   Alcohol use: No   Drug use: No    Allergies:  Allergies  Allergen Reactions   Amoxicillin Itching    Medications Prior to Admission  Medication Sig Dispense Refill Last Dose   cephALEXin (KEFLEX) 500 MG capsule Take 1 capsule (500 mg total) by mouth at bedtime. Start after finished w/ macrobid 30 capsule 0 10/16/2023   acetaminophen (TYLENOL) 325 MG tablet Take 2 tablets (650 mg total) by mouth every 4 (four) hours as needed (for pain scale < 4). (Patient not taking: Reported on 10/14/2023)      Blood Pressure Monitor MISC For regular home bp monitoring during pregnancy (Patient not taking: Reported on 10/14/2023) 1 each 0    ferrous gluconate (FERGON) 324 MG tablet Take 1 tablet (324 mg total) by mouth daily with breakfast. 30 tablet 3    hydrOXYzine (ATARAX) 10 MG tablet Take 1 tablet (10 mg total) by mouth every 8 (eight) hours as needed. For anxiety (Patient not taking: Reported on 10/14/2023) 30 tablet 1    lidocaine (XYLOCAINE) 2 % solution Use as directed 5 mLs in the mouth or throat as needed for mouth  pain. (Patient not taking: Reported on 10/14/2023) 100 mL 0    Prenatal Vit-Fe Fumarate-FA (PRENATAL VITAMIN PO) Take by mouth.      sertraline (ZOLOFT) 25 MG tablet Take 1 tablet (25 mg total) by mouth daily. (Patient not taking: Reported on 10/14/2023) 30 tablet 3     Review of Systems Physical Exam   Blood pressure 122/76, pulse (!) 125, temperature 98 F (36.7 C), temperature source Oral, resp. rate 18, height 5\' 2"  (1.575 m), weight 75.3 kg, last menstrual period 01/21/2023, SpO2 100%, not currently breastfeeding.  Physical Exam  MAU Course  Procedures  MDM ***  Assessment and Plan  ***  Tiffany Hansen 10/16/2023, 11:48 PM

## 2023-10-17 DIAGNOSIS — O368130 Decreased fetal movements, third trimester, not applicable or unspecified: Secondary | ICD-10-CM

## 2023-10-17 DIAGNOSIS — Z3A38 38 weeks gestation of pregnancy: Secondary | ICD-10-CM

## 2023-10-18 ENCOUNTER — Encounter (HOSPITAL_COMMUNITY): Payer: Self-pay | Admitting: Obstetrics & Gynecology

## 2023-10-18 ENCOUNTER — Other Ambulatory Visit: Payer: Self-pay

## 2023-10-18 ENCOUNTER — Inpatient Hospital Stay (HOSPITAL_COMMUNITY)
Admission: AD | Admit: 2023-10-18 | Discharge: 2023-10-20 | DRG: 807 | Disposition: A | Discharge status: Home / Self Care | Payer: MEDICAID | Attending: Obstetrics and Gynecology | Admitting: Obstetrics and Gynecology

## 2023-10-18 ENCOUNTER — Inpatient Hospital Stay (HOSPITAL_COMMUNITY): Payer: MEDICAID | Admitting: Anesthesiology

## 2023-10-18 DIAGNOSIS — Z3A38 38 weeks gestation of pregnancy: Secondary | ICD-10-CM

## 2023-10-18 DIAGNOSIS — Z880 Allergy status to penicillin: Secondary | ICD-10-CM

## 2023-10-18 DIAGNOSIS — Z87891 Personal history of nicotine dependence: Secondary | ICD-10-CM | POA: Diagnosis not present

## 2023-10-18 DIAGNOSIS — O368130 Decreased fetal movements, third trimester, not applicable or unspecified: Secondary | ICD-10-CM | POA: Diagnosis not present

## 2023-10-18 DIAGNOSIS — O99344 Other mental disorders complicating childbirth: Secondary | ICD-10-CM | POA: Diagnosis not present

## 2023-10-18 DIAGNOSIS — F418 Other specified anxiety disorders: Secondary | ICD-10-CM

## 2023-10-18 DIAGNOSIS — O9902 Anemia complicating childbirth: Secondary | ICD-10-CM | POA: Diagnosis present

## 2023-10-18 DIAGNOSIS — O26893 Other specified pregnancy related conditions, third trimester: Secondary | ICD-10-CM | POA: Diagnosis present

## 2023-10-18 DIAGNOSIS — Z87440 Personal history of urinary (tract) infections: Secondary | ICD-10-CM

## 2023-10-18 LAB — CBC
HCT: 30.8 % — ABNORMAL LOW (ref 36.0–46.0)
Hemoglobin: 9.5 g/dL — ABNORMAL LOW (ref 12.0–15.0)
MCH: 25.7 pg — ABNORMAL LOW (ref 26.0–34.0)
MCHC: 30.8 g/dL (ref 30.0–36.0)
MCV: 83.5 fL (ref 80.0–100.0)
Platelets: 226 10*3/uL (ref 150–400)
RBC: 3.69 MIL/uL — ABNORMAL LOW (ref 3.87–5.11)
RDW: 15.2 % (ref 11.5–15.5)
WBC: 9.9 10*3/uL (ref 4.0–10.5)
nRBC: 0 % (ref 0.0–0.2)

## 2023-10-18 LAB — RPR: RPR Ser Ql: NONREACTIVE

## 2023-10-18 LAB — TYPE AND SCREEN
ABO/RH(D): A POS
Antibody Screen: NEGATIVE

## 2023-10-18 LAB — POCT FERN TEST: POCT Fern Test: NEGATIVE

## 2023-10-18 MED ORDER — ONDANSETRON HCL 4 MG/2ML IJ SOLN: 4.0000 mg | Freq: Four times a day (QID) | INTRAMUSCULAR | Status: DC | PRN

## 2023-10-18 MED ORDER — DIPHENHYDRAMINE HCL 50 MG/ML IJ SOLN: 12.5000 mg | INTRAMUSCULAR | Status: DC | PRN

## 2023-10-18 MED ORDER — BENZOCAINE-MENTHOL 20-0.5 % EX AERO: 1.0000 | INHALATION_SPRAY | CUTANEOUS | Status: DC | PRN

## 2023-10-18 MED ORDER — ZOLPIDEM TARTRATE 5 MG PO TABS: 5.0000 mg | ORAL_TABLET | Freq: Every evening | ORAL | Status: DC | PRN

## 2023-10-18 MED ORDER — DIPHENHYDRAMINE HCL 25 MG PO CAPS: 25.0000 mg | ORAL_CAPSULE | Freq: Four times a day (QID) | ORAL | Status: DC | PRN

## 2023-10-18 MED ORDER — EPHEDRINE 5 MG/ML INJ
10.0000 mg | INTRAVENOUS | Status: DC | PRN
Start: 2023-10-18 — End: 2023-10-18

## 2023-10-18 MED ORDER — SIMETHICONE 80 MG PO CHEW: 80.0000 mg | CHEWABLE_TABLET | ORAL | Status: DC | PRN

## 2023-10-18 MED ORDER — IBUPROFEN 600 MG PO TABS
600.0000 mg | ORAL_TABLET | Freq: Four times a day (QID) | ORAL | Status: DC
Start: 2023-10-19 — End: 2023-10-20
  Administered 2023-10-18 – 2023-10-20 (×6): 600 mg via ORAL
  Filled 2023-10-18 (×7): qty 1

## 2023-10-18 MED ORDER — PHENYLEPHRINE 80 MCG/ML (10ML) SYRINGE FOR IV PUSH (FOR BLOOD PRESSURE SUPPORT): 80.0000 ug | PREFILLED_SYRINGE | INTRAVENOUS | Status: DC | PRN

## 2023-10-18 MED ORDER — OXYTOCIN BOLUS FROM INFUSION
333.0000 mL | Freq: Once | INTRAVENOUS | Status: AC
  Administered 2023-10-18: 333 mL via INTRAVENOUS

## 2023-10-18 MED ORDER — OXYTOCIN-SODIUM CHLORIDE 30-0.9 UT/500ML-% IV SOLN: 2.5000 [IU]/h | INTRAVENOUS | Status: DC | PRN

## 2023-10-18 MED ORDER — ONDANSETRON HCL 4 MG PO TABS: 4.0000 mg | ORAL_TABLET | ORAL | Status: DC | PRN

## 2023-10-18 MED ORDER — LIDOCAINE HCL (PF) 1 % IJ SOLN: 30.0000 mL | INTRAMUSCULAR | Status: DC | PRN

## 2023-10-18 MED ORDER — MEASLES, MUMPS & RUBELLA VAC IJ SOLR: 0.5000 mL | Freq: Once | INTRAMUSCULAR | Status: DC

## 2023-10-18 MED ORDER — COCONUT OIL OIL: 1.0000 | TOPICAL_OIL | Status: DC | PRN

## 2023-10-18 MED ORDER — LIDOCAINE HCL (PF) 1 % IJ SOLN
INTRAMUSCULAR | Status: DC | PRN
  Administered 2023-10-18: 10 mL via EPIDURAL

## 2023-10-18 MED ORDER — ACETAMINOPHEN 325 MG PO TABS
650.0000 mg | ORAL_TABLET | ORAL | Status: DC | PRN
  Administered 2023-10-19: 650 mg via ORAL
  Filled 2023-10-18: qty 2

## 2023-10-18 MED ORDER — PRENATAL MULTIVITAMIN CH
1.0000 | ORAL_TABLET | Freq: Every day | ORAL | Status: DC
Start: 2023-10-19 — End: 2023-10-20
  Administered 2023-10-20: 1 via ORAL
  Filled 2023-10-18 (×2): qty 1

## 2023-10-18 MED ORDER — SENNOSIDES-DOCUSATE SODIUM 8.6-50 MG PO TABS
2.0000 | ORAL_TABLET | ORAL | Status: DC
  Administered 2023-10-18 – 2023-10-19 (×2): 2 via ORAL
  Filled 2023-10-18 (×2): qty 2

## 2023-10-18 MED ORDER — LACTATED RINGERS IV SOLN: INTRAVENOUS | Status: DC

## 2023-10-18 MED ORDER — ACETAMINOPHEN 325 MG PO TABS: 650.0000 mg | ORAL_TABLET | ORAL | Status: DC | PRN

## 2023-10-18 MED ORDER — FENTANYL-BUPIVACAINE-NACL 0.5-0.125-0.9 MG/250ML-% EP SOLN
EPIDURAL | Status: AC
  Filled 2023-10-18: qty 250

## 2023-10-18 MED ORDER — OXYCODONE HCL 5 MG PO TABS: 5.0000 mg | ORAL_TABLET | ORAL | Status: DC | PRN

## 2023-10-18 MED ORDER — DIBUCAINE (PERIANAL) 1 % EX OINT: 1.0000 | TOPICAL_OINTMENT | CUTANEOUS | Status: DC | PRN

## 2023-10-18 MED ORDER — TERBUTALINE SULFATE 1 MG/ML IJ SOLN: 0.2500 mg | Freq: Once | INTRAMUSCULAR | Status: DC | PRN

## 2023-10-18 MED ORDER — WITCH HAZEL-GLYCERIN EX PADS: 1.0000 | MEDICATED_PAD | CUTANEOUS | Status: DC | PRN

## 2023-10-18 MED ORDER — LACTATED RINGERS IV SOLN
500.0000 mL | Freq: Once | INTRAVENOUS | Status: AC
  Administered 2023-10-18: 500 mL via INTRAVENOUS

## 2023-10-18 MED ORDER — HYDROXYZINE HCL 50 MG PO TABS: 50.0000 mg | ORAL_TABLET | Freq: Four times a day (QID) | ORAL | Status: DC | PRN

## 2023-10-18 MED ORDER — LACTATED RINGERS IV SOLN
INTRAVENOUS | Status: DC
Start: 2023-10-18 — End: 2023-10-18

## 2023-10-18 MED ORDER — FENTANYL CITRATE (PF) 100 MCG/2ML IJ SOLN: 100.0000 ug | INTRAMUSCULAR | Status: DC | PRN

## 2023-10-18 MED ORDER — FENTANYL-BUPIVACAINE-NACL 0.5-0.125-0.9 MG/250ML-% EP SOLN
12.0000 mL/h | EPIDURAL | Status: DC | PRN
  Administered 2023-10-18: 12 mL/h via EPIDURAL

## 2023-10-18 MED ORDER — OXYCODONE-ACETAMINOPHEN 5-325 MG PO TABS: 1.0000 | ORAL_TABLET | ORAL | Status: DC | PRN

## 2023-10-18 MED ORDER — OXYTOCIN-SODIUM CHLORIDE 30-0.9 UT/500ML-% IV SOLN
2.5000 [IU]/h | INTRAVENOUS | Status: DC
Start: 2023-10-18 — End: 2023-10-18
  Filled 2023-10-18: qty 500

## 2023-10-18 MED ORDER — SOD CITRATE-CITRIC ACID 500-334 MG/5ML PO SOLN: 30.0000 mL | ORAL | Status: DC | PRN

## 2023-10-18 MED ORDER — FLEET ENEMA RE ENEM: 1.0000 | ENEMA | RECTAL | Status: DC | PRN

## 2023-10-18 MED ORDER — OXYTOCIN-SODIUM CHLORIDE 30-0.9 UT/500ML-% IV SOLN
1.0000 m[IU]/min | INTRAVENOUS | Status: DC
  Administered 2023-10-18: 2 m[IU]/min via INTRAVENOUS

## 2023-10-18 MED ORDER — ONDANSETRON HCL 4 MG/2ML IJ SOLN: 4.0000 mg | INTRAMUSCULAR | Status: DC | PRN

## 2023-10-18 MED ORDER — OXYCODONE-ACETAMINOPHEN 5-325 MG PO TABS: 2.0000 | ORAL_TABLET | ORAL | Status: DC | PRN

## 2023-10-18 MED ORDER — LACTATED RINGERS IV SOLN: 500.0000 mL | INTRAVENOUS | Status: DC | PRN

## 2023-10-18 NOTE — Progress Notes (Signed)
LABOR PROGRESS NOTE  Tiffany Hansen is a 17 y.o. [id number] at [redacted]w[redacted]d  admitted for SOL  Subjective: Feeling comfortable after epidural  Objective: BP (!) 110/51   Pulse 93   Temp 97.9 F (36.6 C) (Oral)   Resp 18   Ht 5\' 2"  (1.575 m)   Wt 73.5 kg   LMP 01/21/2023   SpO2 98%   BMI 29.63 kg/m  or  Vitals:   10/18/23 1200 10/18/23 1227 10/18/23 1230 10/18/23 1300  BP: (!) 105/42  (!) 104/44 (!) 110/51  Pulse: 100  100 93  Resp:   18   Temp:  97.9 F (36.6 C)    TempSrc:  Oral    SpO2: 98%  98%   Weight:      Height:        120/mod/+a/-d Dilation: 7 Effacement (%): 100 Station: -2 Presentation: Vertex Exam by:: [name] RN  Labs: Lab Results  Component Value Date   WBC 9.9 10/18/2023   HGB 9.5 (L) 10/18/2023   HCT 30.8 (L) 10/18/2023   MCV 83.5 10/18/2023   PLT 226 10/18/2023    Patient Active Problem List   Diagnosis Date Noted   Indication for care in labor or delivery 10/18/2023   Anemia in pregnancy, second trimester 07/30/2023   Asymptomatic bacteriuria during pregnancy in second trimester 05/20/2023   History of preterm delivery 04/22/2023   History of recurrent UTI (urinary tract infection) 04/22/2023   Encounter for supervision of normal pregnancy, antepartum 04/19/2023   Depression with anxiety 03/06/2023   MDD (major depressive disorder), recurrent episode, severe (HCC) 03/19/2017   Chronic constipation     Assessment / Plan: 17 y.o. [id number] at [redacted]w[redacted]d here for SOL  Labor: cervix unchanged, patient requesting augmentation, discussed arom and (small) risk of cord prolapse vs pitocin and patient elects pitocin so will start that, will continue to consider arom as needed for augmentation Fetal Wellbeing:  cat 1 Pain Control:  s/p epidural Anticipated MOD:  vaginal  Silvano Bilis, MD 10/18/2023, 1:23 PM

## 2023-10-18 NOTE — Lactation Note (Signed)
This note was copied from a baby's chart. Lactation Consultation Note  Patient Name: Tiffany Hansen Date: 10/18/2023 Age:19 hours Reason for consult: Initial assessment  P1- MOB is formula feeding only.  Feeding Mother's Current Feeding Choice: Formula  Consult Status Consult Status: Complete Date: 10/18/23    Dema Severin BS, IBCLC 10/18/2023, 5:58 PM

## 2023-10-18 NOTE — MAU Note (Signed)
.  Tiffany Hansen is a 17 y.o. at [redacted]w[redacted]d here in MAU reporting: 0330 started having ctx - a minute apart. Along with lower back pain. Denies VB. "I think I was leaking a little bit" before leaving her house. Reports less than normal FM.   Onset of complaint: 0330 Pain score: 8 Vitals:   10/18/23 0615  BP: 130/74  Pulse: (!) 109  Resp: 18  Temp: 97.9 F (36.6 C)  SpO2: 100%     FHT:131 Lab orders placed from triage: labor eval

## 2023-10-18 NOTE — Anesthesia Preprocedure Evaluation (Signed)
Anesthesia Evaluation  Patient identified by MRN, date of birth, ID band Patient awake    Reviewed: Allergy & Precautions, H&P , NPO status , Patient's Chart, lab work & pertinent test results  History of Anesthesia Complications Negative for: history of anesthetic complications  Airway Mallampati: I       Dental no notable dental hx.    Pulmonary former smoker   Pulmonary exam normal        Cardiovascular negative cardio ROS Normal cardiovascular exam     Neuro/Psych   Anxiety Depression    negative neurological ROS     GI/Hepatic negative GI ROS, Neg liver ROS,,,  Endo/Other  negative endocrine ROS    Renal/GU negative Renal ROS  negative genitourinary   Musculoskeletal   Abdominal Normal abdominal exam  (+)   Peds  Hematology  (+) Blood dyscrasia, anemia   Anesthesia Other Findings   Reproductive/Obstetrics (+) Pregnancy                             Anesthesia Physical Anesthesia Plan  ASA: 2  Anesthesia Plan: Epidural   Post-op Pain Management:    Induction:   PONV Risk Score and Plan:   Airway Management Planned:   Additional Equipment:   Intra-op Plan:   Post-operative Plan:   Informed Consent: I have reviewed the patients History and Physical, chart, labs and discussed the procedure including the risks, benefits and alternatives for the proposed anesthesia with the patient or authorized representative who has indicated his/her understanding and acceptance.       Plan Discussed with:   Anesthesia Plan Comments:         Anesthesia Quick Evaluation

## 2023-10-18 NOTE — Discharge Summary (Signed)
Postpartum Discharge Summary  Date of Service updated***     Patient Name: Tiffany Hansen DOB: Jul 13, 2006 MRN: [id number]  Date of admission: 10/18/2023 Delivery date:10/18/2023 Delivering provider: Shonna Chock [place] Date of discharge: 10/18/2023  Admitting diagnosis: Indication for care in labor or delivery [O75.9] Intrauterine pregnancy: [redacted]w[redacted]d     Secondary diagnosis:  Principal Problem:   Indication for care in labor or delivery  Additional problems: ***    Discharge diagnosis: Term Pregnancy Delivered                                              Post partum procedures:{Postpartum procedures:23558} Augmentation: AROM and Pitocin Complications: None  Hospital course: Onset of Labor With Vaginal Delivery      17 y.o. yo [id number] at [redacted]w[redacted]d was admitted in Active Labor on 10/18/2023. Labor course was complicated by nothing  Membrane Rupture Time/Date: 4:21 PM,10/18/2023  Delivery Method:Vaginal, Spontaneous Operative Delivery:N/A Episiotomy: None Lacerations:    Patient had a postpartum course complicated by ***.  She is ambulating, tolerating a regular diet, passing flatus, and urinating well. Patient is discharged home in stable condition on 10/18/23.  Newborn Data: Birth date:10/18/2023 Birth time:5:33 PM Gender:Female Living status:Living Apgars:8 ,9  Weight:   Magnesium Sulfate received: No BMZ received: No Rhophylac:N/A MMR:{MMR:30440033} T-DaP:{Tdap:23962} Flu: {EPP:29518} RSV Vaccine received: {RSV:31013} Transfusion:{Transfusion received:30440034}  Immunizations received: Immunization History  Administered Date(s) Administered   Influenza-Unspecified 08/14/2016   Tdap 10/30/2021, 08/20/2023    Physical exam  Vitals:   10/18/23 1530 10/18/23 1600 10/18/23 1630 10/18/23 1700  BP: 123/66 120/71 113/81 119/70  Pulse: 96 91 (!) 136 (!) 121  Resp:  18 18 18   Temp:   98.4 F (36.9 C)   TempSrc:   Oral   SpO2: 100% 100% 100% 100%  Weight:      Height:        General: {Exam; general:21111117} Lochia: {Desc; appropriate/inappropriate:30686::"appropriate"} Uterine Fundus: {Desc; firm/soft:30687} Incision: {Exam; incision:21111123} DVT Evaluation: {Exam; dvt:2111122} Labs: Lab Results  Component Value Date   WBC 9.9 10/18/2023   HGB 9.5 (L) 10/18/2023   HCT 30.8 (L) 10/18/2023   MCV 83.5 10/18/2023   PLT 226 10/18/2023      Latest Ref Rng & Units 08/01/2023   11:42 AM  CMP  Glucose 70 - 99 mg/dL 80   BUN 6 - 20 mg/dL <5   Creatinine 8.41 - 1.00 mg/dL 6.60   Sodium 630 - 160 mmol/L 138   Potassium 3.5 - 5.1 mmol/L 3.1   Chloride 98 - 111 mmol/L 100   CO2 22 - 32 mmol/L 23   Calcium 8.9 - 10.3 mg/dL 8.9   Total Protein 6.5 - 8.1 g/dL 6.3   Total Bilirubin 0.3 - 1.2 mg/dL 0.5   Alkaline Phos 38 - 126 U/L 70   AST 15 - 41 U/L 20   ALT 0 - 44 U/L 17    Edinburgh Score:    01/17/2022    9:49 AM  Edinburgh Postnatal Depression Scale Screening Tool  I have been able to laugh and see the funny side of things. 0  I have looked forward with enjoyment to things. 0  I have blamed myself unnecessarily when things went wrong. 0  I have been anxious or worried for no good reason. 0  I have felt scared or panicky for no good reason. 0  Things have been getting on top of me. 0  I have been so unhappy that I have had difficulty sleeping. 0  I have felt sad or miserable. 0  I have been so unhappy that I have been crying. 0  The thought of harming myself has occurred to me. 0  Edinburgh Postnatal Depression Scale Total 0   No data recorded  After visit meds:  Allergies as of 10/18/2023       Reactions   Amoxicillin Itching     Med Rec must be completed prior to using this [facility]***        Discharge home in stable condition Infant Feeding: {Baby feeding:23562} Infant Disposition:{CHL IP OB HOME WITH OZHYQM:57846} Discharge instruction: per After Visit Summary and Postpartum booklet. Activity: Advance as tolerated.  Pelvic rest for 6 weeks.  Diet: {OB NGEX:52841324} Future Appointments: Future Appointments  Date Time Provider Department Center  10/21/2023  1:30 PM Cheral Marker, CNM [facility]  10/28/2023  1:30 PM Cheral Marker, CNM [facility]  10/28/2023  1:30 PM [facility] NURSE [facility]   Follow up Visit:   Please schedule this patient for a {Visit type:23955} postpartum visit in {Postpartum visit:23953} with the following provider: {Provider type:23954}. Additional Postpartum F/U:{PP Procedure:23957}  {Risk level:23960} pregnancy complicated by: {complication:23959} Delivery mode:  Vaginal, Spontaneous Anticipated Birth Control:  {Birth Control:23956}   10/18/2023 Silvano Bilis, MD

## 2023-10-18 NOTE — Progress Notes (Signed)
LABOR PROGRESS NOTE  Tiffany Hansen is a 17 y.o. L8X2119 at [redacted]w[redacted]d  admitted for sol  Subjective: Feeling comfortable  Objective: BP 120/71   Pulse 91   Temp 97.9 F (36.6 C) (Oral)   Resp 18   Ht 5\' 2"  (1.575 m)   Wt 73.5 kg   LMP 01/21/2023   SpO2 100%   BMI 29.63 kg/m  or  Vitals:   10/18/23 1430 10/18/23 1500 10/18/23 1530 10/18/23 1600  BP: 121/72 119/71 123/66 120/71  Pulse: 95 (!) 101 96 91  Resp: 18 18  18   Temp:      TempSrc:      SpO2: 100% 100% 100% 100%  Weight:      Height:        130/mod/+a/-d Dilation: 8 Effacement (%): 90 Station: -2 Presentation: Vertex Exam by:: Dr. Ashok Pall  Labs: Lab Results  Component Value Date   WBC 9.9 10/18/2023   HGB 9.5 (L) 10/18/2023   HCT 30.8 (L) 10/18/2023   MCV 83.5 10/18/2023   PLT 226 10/18/2023    Patient Active Problem List   Diagnosis Date Noted   Indication for care in labor or delivery 10/18/2023   Anemia in pregnancy, second trimester 07/30/2023   Asymptomatic bacteriuria during pregnancy in second trimester 05/20/2023   History of preterm delivery 04/22/2023   History of recurrent UTI (urinary tract infection) 04/22/2023   Encounter for supervision of normal pregnancy, antepartum 04/19/2023   Depression with anxiety 03/06/2023   MDD (major depressive disorder), recurrent episode, severe (HCC) 03/19/2017   Chronic constipation     Assessment / Plan: 17 y.o. [id number] at [redacted]w[redacted]d here for sol  Labor: making slow progress now with pitocin augmentation, just now s/p arom 16:20 clear, will manage expectantly Fetal Wellbeing:  cat 1 Pain Control:  s/p epidural Anticipated MOD:  vaginal  Silvano Bilis, MD 10/18/2023, 4:29 PM

## 2023-10-18 NOTE — H&P (Signed)
LABOR AND DELIVERY ADMISSION HISTORY AND PHYSICAL NOTE  Tiffany Hansen is a 17 y.o. female G3P0111 with IUP at [redacted]w[redacted]d by L/8 presenting for painful contractions beginning early this morning.   She reports positive fetal movement. She denies leakage of fluid or vaginal bleeding.  Prenatal History/Complications:  Past Medical History: Past Medical History:  Diagnosis Date   Abdominal pain, recurrent    Anxiety    Constipation, chronic    Depression    History of UTI     Past Surgical History: Past Surgical History:  Procedure Laterality Date   NO PAST SURGERIES      Obstetrical History: OB History     Gravida  3   Para  1   Term  0   Preterm  1   AB  1   Living  1      SAB  1   IAB  0   Ectopic  0   Multiple  0   Live Births  1           Social History: Social History   Socioeconomic History   Marital status: Single    Spouse name: Not on file   Number of children: Not on file   Years of education: Not on file   Highest education level: Not on file  Occupational History   Not on file  Tobacco Use   Smoking status: Former    Current packs/day: 0.00    Types: Cigarettes    Quit date: 03/2021    Years since quitting: 2.5   Smokeless tobacco: Never  Vaping Use   Vaping status: Former   Substances: Nicotine  Substance and Sexual Activity   Alcohol use: No   Drug use: No   Sexual activity: Yes    Birth control/protection: None  Other Topics Concern   Not on file  Social History Narrative   Not on file   Social Determinants of Health   Financial Resource Strain: Low Risk  (04/22/2023)   Overall Financial Resource Strain (CARDIA)    Difficulty of Paying Living Expenses: Not very hard  Food Insecurity: No Food Insecurity (10/18/2023)   Hunger Vital Sign    Worried About Running Out of Food in the Last Year: Never true    Ran Out of Food in the Last Year: Never true  Transportation Needs: No Transportation Needs (10/18/2023)   PRAPARE -  Administrator, Civil Service (Medical): No    Lack of Transportation (Non-Medical): No  Physical Activity: Inactive (04/22/2023)   Exercise Vital Sign    Days of Exercise per Week: 0 days    Minutes of Exercise per Session: 0 min  Stress: Patient Declined (04/22/2023)   [employer] of Occupational Health - Occupational Stress Questionnaire    Feeling of Stress : Patient declined  Social Connections: Moderately Isolated (04/22/2023)   Social Connection and Isolation Panel [NHANES]    Frequency of Communication with Friends and Family: Three times a week    Frequency of Social Gatherings with Friends and Family: Never    Attends Religious Services: Never    Database administrator or Organizations: No    Attends Engineer, structural: Never    Marital Status: Living with partner    Family History: Family History  Problem Relation Age of Onset   Celiac disease Neg Hx    Ulcers Neg Hx    Cholelithiasis Neg Hx     Allergies: Allergies  Allergen Reactions  Amoxicillin Itching    Medications Prior to Admission  Medication Sig Dispense Refill Last Dose   cephALEXin (KEFLEX) 500 MG capsule Take 1 capsule (500 mg total) by mouth at bedtime. Start after finished w/ macrobid 30 capsule 0 10/17/2023 at 2100   ferrous gluconate (FERGON) 324 MG tablet Take 1 tablet (324 mg total) by mouth daily with breakfast. 30 tablet 3 Past Week   acetaminophen (TYLENOL) 325 MG tablet Take 2 tablets (650 mg total) by mouth every 4 (four) hours as needed (for pain scale < 4). (Patient not taking: Reported on 10/14/2023)      Blood Pressure Monitor MISC For regular home bp monitoring during pregnancy (Patient not taking: Reported on 10/14/2023) 1 each 0    hydrOXYzine (ATARAX) 10 MG tablet Take 1 tablet (10 mg total) by mouth every 8 (eight) hours as needed. For anxiety (Patient not taking: Reported on 10/14/2023) 30 tablet 1    Prenatal Vit-Fe Fumarate-FA (PRENATAL VITAMIN PO) Take by  mouth.      sertraline (ZOLOFT) 25 MG tablet Take 1 tablet (25 mg total) by mouth daily. (Patient not taking: Reported on 10/14/2023) 30 tablet 3      Review of Systems   All systems reviewed and negative except as stated in HPI  Blood pressure 125/69, pulse 91, temperature 98.7 F (37.1 C), temperature source Oral, resp. rate 18, height 5\' 2"  (1.575 m), weight 73.5 kg, last menstrual period 01/21/2023, SpO2 100%, not currently breastfeeding. General appearance: alert, cooperative, and appears stated age Lungs: clear to auscultation bilaterally Heart: regular rate and rhythm Abdomen: soft, non-tender; bowel sounds normal Extremities: No calf swelling or tenderness Presentation: cephalic per rn exam Fetal monitoring: 135/mod/+a/-d Uterine activity: q 4 Dilation: 7 Effacement (%): 100 Station: -1 Exam by:: [name] RN   Prenatal labs: ABO, Rh: --/--/A POS (11/29 0813) Antibody: NEG (11/29 0813) Rubella: 10.40 (06/03 1041) RPR: Non Reactive (09/09 0827)  HBsAg: Negative (06/03 1041)  HIV: Non Reactive (09/09 0827)  GBS: --Theda Sers (11/11 1330)  2 hr Glucola: wnl Genetic screening:  wnl Anatomy US: wnl  Prenatal Transfer Tool  Maternal Diabetes: No Genetic Screening: Normal Maternal Ultrasounds/Referrals: Normal Fetal Ultrasounds or other Referrals:  None Maternal Substance Abuse:  No Significant Maternal Medications:  None Significant Maternal Lab Results: Group B Strep negative  Results for orders placed or performed during the hospital encounter of 10/18/23 (from the past 24 hour(s))  POCT fern test   Collection Time: 10/18/23  6:52 AM  Result Value Ref Range   POCT Fern Test Negative = intact amniotic membranes   CBC   Collection Time: 10/18/23  8:09 AM  Result Value Ref Range   WBC 9.9 4.0 - 10.5 K/uL   RBC 3.69 (L) 3.87 - 5.11 MIL/uL   Hemoglobin 9.5 (L) 12.0 - 15.0 g/dL   HCT 16.1 (L) 09.6 - 04.5 %   MCV 83.5 80.0 - 100.0 fL   MCH 25.7 (L) 26.0 - 34.0  pg   MCHC 30.8 30.0 - 36.0 g/dL   RDW 40.9 81.1 - 91.4 %   Platelets 226 150 - 400 K/uL   nRBC 0.0 0.0 - 0.2 %  Type and screen [facility]   Collection Time: 10/18/23  8:13 AM  Result Value Ref Range   ABO/RH(D) A POS    Antibody Screen NEG    Sample Expiration      10/21/2023,2359 Performed at [facility], [address] [place], [place] [address]  Patient Active Problem List   Diagnosis Date Noted   Indication for care in labor or delivery 10/18/2023   Anemia in pregnancy, second trimester 07/30/2023   Asymptomatic bacteriuria during pregnancy in second trimester 05/20/2023   History of preterm delivery 04/22/2023   History of recurrent UTI (urinary tract infection) 04/22/2023   Encounter for supervision of normal pregnancy, antepartum 04/19/2023   Depression with anxiety 03/06/2023   MDD (major depressive disorder), recurrent episode, severe (HCC) 03/19/2017   Chronic constipation     Assessment: Krithika Romaniello is a 17 y.o. Z6X0960 at [redacted]w[redacted]d here for SOL  #Labor: expectant, arom as needed #Pain: S/p epidural #FWB: Cat 1 #ID:  Gbs neg #MOF: bottl #MOC: undecided #Circ:  Yes, inpt  Briahna Pescador B Folasade Mooty 10/18/2023, 9:39 AM

## 2023-10-18 NOTE — Anesthesia Procedure Notes (Addendum)
Epidural Patient location during procedure: OB Start time: 10/18/2023 8:54 AM End time: 10/18/2023 8:58 AM  Staffing Anesthesiologist: Leilani Able, MD  Preanesthetic Checklist Completed: patient identified, IV checked, site marked, risks and benefits discussed, surgical consent, monitors and equipment checked, pre-op evaluation and timeout performed  Epidural Patient position: sitting Prep: DuraPrep and site prepped and draped Patient monitoring: continuous pulse ox and blood pressure Approach: midline Location: L3-L4 Injection technique: LOR air  Needle:  Needle type: Tuohy  Needle gauge: 17 G Needle length: 9 cm and 9 Needle insertion depth: 5 cm cm Catheter type: closed end flexible Catheter size: 19 Gauge Catheter at skin depth: 10 cm Test dose: negative and Other  Assessment Events: blood not aspirated, no cerebrospinal fluid, injection not painful, no injection resistance, no paresthesia and negative IV test

## 2023-10-19 NOTE — Social Work (Signed)
MOB was referred for history of depression/anxiety. ?* Referral screened out by Clinical Social Worker because none of the following criteria appear to apply: ?~ History of anxiety/depression during this pregnancy, or of post-partum depression following prior delivery. ?~ Diagnosis of anxiety and/or depression within last 3 years ?OR ?* MOB's symptoms currently being treated with medication and/or therapy. ? ?Please contact the Clinical Social Worker if needs arise, by MOB request, or if MOB scores greater than 9/yes to question 10 on Edinburgh Postpartum Depression Screen.  ? ?Yong Wahlquist, LCSW ?Clinical Social Worker ? ?

## 2023-10-19 NOTE — Progress Notes (Signed)
POSTPARTUM PROGRESS NOTE  Post Partum Day 1  Subjective:  Tiffany Hansen is a 17 y.o. Z6X0960 s/p SVD at [redacted]w[redacted]d.  She reports she is doing well. No acute events overnight. She denies any problems with ambulating, voiding or po intake. Denies nausea or vomiting.  Pain is well controlled.  Lochia is appropriate.  Objective: Blood pressure 116/62, pulse 86, temperature 97.9 F (36.6 C), temperature source Oral, resp. rate 17, height 5\' 2"  (1.575 m), weight 73.5 kg, last menstrual period 01/21/2023, SpO2 99%, unknown if currently breastfeeding.  Physical Exam:  General: alert, cooperative and no distress Chest: no respiratory distress Heart:regular rate, distal pulses intact Uterine Fundus: firm, appropriately tender DVT Evaluation: No calf swelling or tenderness Extremities: trace edema Skin: warm, dry  Recent Labs    10/18/23 0809  HGB 9.5*  HCT 30.8*    Assessment/Plan: Tiffany Hansen is a 17 y.o. A5W0981 s/p SVD at [redacted]w[redacted]d   PPD#1 - Doing well  Routine postpartum care Contraception: unsure Feeding: bottle Circ: mom gave consent for baby circ (see infant chart for attestation) Dispo: Plan for discharge 10/20/23.   LOS: 1 day   Hessie Dibble, MD OB Fellow  10/19/2023, 12:46 PM

## 2023-10-19 NOTE — Anesthesia Postprocedure Evaluation (Signed)
Anesthesia Post Note  Patient: Tiffany Hansen  Procedure(s) Performed: AN AD HOC LABOR EPIDURAL     Patient location during evaluation: Mother Baby Anesthesia Type: Epidural Level of consciousness: awake Pain management: satisfactory to patient Vital Signs Assessment: post-procedure vital signs reviewed and stable Respiratory status: spontaneous breathing Cardiovascular status: stable Anesthetic complications: no  No notable events documented.  Last Vitals:  Vitals:   10/19/23 0522 10/19/23 0856  BP: 128/64 116/62  Pulse: 88 86  Resp: 16 17  Temp: 36.4 C 36.6 C  SpO2: 100% 99%    Last Pain:  Vitals:   10/19/23 0856  TempSrc: Oral  PainSc: 0-No pain   Pain Goal:                   [employer]

## 2023-10-20 MED ORDER — IBUPROFEN 600 MG PO TABS: 600.0000 mg | ORAL_TABLET | Freq: Four times a day (QID) | ORAL | 0 refills | Status: DC

## 2023-10-20 NOTE — Plan of Care (Signed)
Discharge teaching complete. Education complete.

## 2023-10-21 ENCOUNTER — Encounter: Payer: Self-pay | Admitting: Women's Health

## 2023-10-21 ENCOUNTER — Encounter: Payer: MEDICAID | Admitting: Women's Health

## 2023-10-22 ENCOUNTER — Encounter: Payer: Self-pay | Admitting: Women's Health

## 2023-10-23 ENCOUNTER — Other Ambulatory Visit: Payer: Self-pay | Admitting: Women's Health

## 2023-10-23 ENCOUNTER — Encounter: Payer: Self-pay | Admitting: Women's Health

## 2023-10-23 MED ORDER — NITROFURANTOIN MONOHYD MACRO 100 MG PO CAPS: 100.0000 mg | ORAL_CAPSULE | Freq: Two times a day (BID) | ORAL | 0 refills | Status: DC

## 2023-10-28 ENCOUNTER — Encounter: Payer: Self-pay | Admitting: Advanced Practice Midwife

## 2023-10-28 ENCOUNTER — Telehealth (HOSPITAL_COMMUNITY): Payer: Self-pay | Admitting: *Deleted

## 2023-10-28 ENCOUNTER — Encounter: Payer: MEDICAID | Admitting: Women's Health

## 2023-10-28 ENCOUNTER — Other Ambulatory Visit: Payer: MEDICAID

## 2023-10-28 NOTE — Telephone Encounter (Signed)
10/28/2023  Name: ANYIA LAVALAIS MRN: [id number] DOB: 2006-08-15  Reason for Call:  Transition of Care Hospital Discharge Call  Contact Status: Patient Contact Status: Complete  Language assistant needed:          Follow-Up Questions: Do You Have Any Concerns About Your Health As You Heal From Delivery?: Yes What Concerns Do You Have About Your Health?: Patient stated, "I don't know if you can help me, but I'm trying to get on some birth control." RN referred patient to her OB for follow-up care and guidance. Patient verbalized understanding. Do You Have Any Concerns About Your Infants Health?: No  Edinburgh Postnatal Depression Scale:  In the Past 7 Days:  EPDS not completed at this time. Patient reported completing screening tool at a pediatrician's appointment since being discharged from the hospital. No areas of concern reported. Patient declined EPDS at this time, stating, "I'm doing good."  PHQ2-9 Depression Scale:     Discharge Follow-up: Edinburgh score requires follow up?: N/A Patient was advised of the following resources:: Breastfeeding Support Group, Support Group  Post-discharge interventions: Reviewed Newborn Safe Sleep Practices  Signature Deforest Hoyles, RN, 10/28/23, [phone]

## 2023-11-18 NOTE — Progress Notes (Deleted)
 Name: Tiffany Hansen DOB: Nov 01, 2006 MRN: [id number]  History of Present Illness: Tiffany Hansen is a 17 y.o. female who presents today as a new patient at [facility]. All available relevant medical records have been reviewed.  ***She is accompanied by ***. - GU History: 1. ***.  She reports chief complaint of recurrent UTls.  Urine culture results in past 12 months: - 03/14/2023: Negative - 04/22/2023: Negative - 06/03/2023: Negative  - 06/24/2023: Positive for Enterococcus faecalis. Treated with Macrobid  by GYN provider (Tiffany Hansen). - 08/20/2023: Negative - 09/23/2023: Grew 50-100k Staphylococcus epidermidis. Treated with Macrobid  then started on nightly Keflex  for UTI prophylaxis by her GYN provider Tiffany Hansen, CNM).  - 10/07/2023: Grew >100k Staphylococcus epidermidis. Treated with Keflex  4x/day x7 days by her GYN provider Tiffany Hansen, CNM) then advised to restart nightly Keflex  for UTI prophylaxis.   Of note, she is postpartum s/p vaginal delivery on 10/18/2023.  ***was she actually symptomatic for these UTIs or was it asymptomatic chronic bacteriuria which was being treated due to her pregnancy?  Urinary Symptoms: She reports *** UTl's in the last year. When present, UTI symptoms include ***dysuria, ***increased urinary urgency, ***frequency, ***. She reports that UTI symptoms do ***not seem to correlate with intercourse. She {Actions; denies-reports:120008} acute UTI symptoms today.  At baseline: She {Actions; denies-reports:120008} urinary urgency, frequency, dysuria, gross hematuria, hesitancy, straining to void, or sensations of incomplete emptying. She reports voiding *** times per day and *** times at night. She {Actions; denies-reports:120008} pushing on a bulge in order to empty her bladder.  She {Actions; denies-reports:120008} urge incontinence. She {Actions; denies-reports:120008} stress incontinence with ***cough/***laugh/***sneeze/***heavy  lifting/***exercise. She {Actions; denies-reports:120008} enuresis. She leaks *** times per ***. Wears *** ***pads / ***diapers per day. She states the ***SUI / ***UUI is predominant. This has been going on for {NUMBERS 1-12:18279} {days/wks/mos/yrs:310907} and {ACTION; IS/IS WNU:[id number]} significantly bothersome. In terms of treatment, She has tried ***.  She {Actions; denies-reports:120008} caffeine intake.  She {Actions; denies-reports:120008} history of pyelonephritis.  She {Actions; denies-reports:120008} history of kidney stones.  Vaginal / prolapse Symptoms: She {Actions; denies-reports:120008} vaginal bulge sensation.  She {Actions; denies-reports:120008} seeing a vaginal bulge. This bulge is bothersome and is the size of ***. It was first noticed ***.  She {Actions; denies-reports:120008} vaginal pain, bleeding, or discharge.  She {Actions; denies-reports:120008} use of topical vaginal estrogen cream.  Bowel Symptoms: She has a continent bowel movement *** time(s) per ***. Typical Bristol stool scale of continent bowel episodes is Type ***. She {Actions; denies-reports:120008} Fl episodes. Fecal incontinence started *** and occurs *** times per week. Typical Bristol stool scale of incontinent bowel episodes is Type ***. She {Actions; denies-reports:120008} straining and {Actions; denies-reports:120008} splinting to defecate. She {Actions; denies-reports:120008} rectal bleeding. She {Actions; denies-reports:120008} feeling like she fully empties her rectum with defecation. She {Actions; denies-reports:120008} urgency with defecation. She {Actions; denies-reports:120008} difficulty wiping clean. She {Actions; denies-reports:120008} taking laxatives, stool softeners, or fiber supplements. Last colonoscopy was***.  Past OB/GYN History: OB History     Gravida  3   Para  2   Term  1   Preterm  1   AB  1   Living  2      SAB  1   IAB  0   Ectopic  0   Multiple  0    Live Births  2          She {Actions; denies-reports:120008} being sexually active.  She {Actions; denies-reports:120008} dyspareunia. ***Dyspareunia is located ***at  the introitus / ***deep inside the vagina. She has *** child(ren) who were delivered ***vaginally ***via c-section. She {Actions; denies-reports:120008} significant tearing with that ***delivery / those ***deliveries. She is {DESC; PRE/POST:32304} menopausal.  Last pap smear was ***. She {Actions; denies-reports:120008} history of *** hysterectomy.  Fall Screening: Do you usually have a device to assist in your mobility? {yes/no:20286} ***cane / ***walker / ***wheelchair  Medications: Current Outpatient Medications  Medication Sig Dispense Refill   acetaminophen  (TYLENOL ) 325 MG tablet Take 2 tablets (650 mg total) by mouth every 4 (four) hours as needed (for pain scale < 4). (Patient not taking: Reported on 10/14/2023)     ferrous gluconate  (FERGON) 324 MG tablet Take 1 tablet (324 mg total) by mouth daily with breakfast. 30 tablet 3   hydrOXYzine  (ATARAX ) 10 MG tablet Take 1 tablet (10 mg total) by mouth every 8 (eight) hours as needed. For anxiety (Patient not taking: Reported on 10/14/2023) 30 tablet 1   ibuprofen  (ADVIL ) 600 MG tablet Take 1 tablet (600 mg total) by mouth every 6 (six) hours. 30 tablet 0   nitrofurantoin , macrocrystal-monohydrate, (MACROBID ) 100 MG capsule Take 1 capsule (100 mg total) by mouth 2 (two) times daily. X 7 days 14 capsule 0   Prenatal Vit-Fe Fumarate-FA (PRENATAL VITAMIN PO) Take by mouth.     sertraline  (ZOLOFT ) 25 MG tablet Take 1 tablet (25 mg total) by mouth daily. (Patient not taking: Reported on 10/14/2023) 30 tablet 3   No current facility-administered medications for this visit.    Allergies: Allergies  Allergen Reactions   Amoxicillin Itching    Past Medical History:  Diagnosis Date   Abdominal pain, recurrent    Anxiety    Constipation, chronic    Depression     History of UTI    Past Surgical History:  Procedure Laterality Date   NO PAST SURGERIES     Family History  Problem Relation Age of Onset   Celiac disease Neg Hx    Ulcers Neg Hx    Cholelithiasis Neg Hx    Social History   Socioeconomic History   Marital status: Single    Spouse name: Not on file   Number of children: Not on file   Years of education: Not on file   Highest education level: Not on file  Occupational History   Not on file  Tobacco Use   Smoking status: Former    Current packs/day: 0.00    Types: Cigarettes    Quit date: 03/2021    Years since quitting: 2.6   Smokeless tobacco: Never  Vaping Use   Vaping status: Former   Substances: Nicotine  Substance and Sexual Activity   Alcohol use: No   Drug use: No   Sexual activity: Yes    Birth control/protection: None  Other Topics Concern   Not on file  Social History Narrative   Not on file   Social Drivers of Health   Financial Resource Strain: Low Risk  (04/22/2023)   Overall Financial Resource Strain (CARDIA)    Difficulty of Paying Living Expenses: Not very hard  Food Insecurity: No Food Insecurity (10/18/2023)   Hunger Vital Sign    Worried About Running Out of Food in the Last Year: Never true    Ran Out of Food in the Last Year: Never true  Transportation Needs: No Transportation Needs (10/18/2023)   PRAPARE - Administrator, Civil Service (Medical): No    Lack of Transportation (Non-Medical): No  Physical  Activity: Inactive (04/22/2023)   Exercise Vital Sign    Days of Exercise per Week: 0 days    Minutes of Exercise per Session: 0 min  Stress: Patient Declined (04/22/2023)   [employer] of Occupational Health - Occupational Stress Questionnaire    Feeling of Stress : Patient declined  Social Connections: Moderately Isolated (04/22/2023)   Social Connection and Isolation Panel [NHANES]    Frequency of Communication with Friends and Family: Three times a week    Frequency of  Social Gatherings with Friends and Family: Never    Attends Religious Services: Never    Database Administrator or Organizations: No    Attends Banker Meetings: Never    Marital Status: Living with partner  Intimate Partner Violence: Not At Risk (10/18/2023)   Humiliation, Afraid, Rape, and Kick questionnaire    Fear of Current or Ex-Partner: No    Emotionally Abused: No    Physically Abused: No    Sexually Abused: No    SUBJECTIVE  Review of Systems Constitutional: Patient denies any unintentional weight loss or change in strength lntegumentary: Patient denies any rashes or pruritus Cardiovascular: Patient denies chest pain or syncope Respiratory: Patient denies shortness of breath Gastrointestinal: Patient ***denies nausea, vomiting, constipation, or diarrhea Musculoskeletal: Patient denies muscle cramps or weakness Neurologic: Patient denies convulsions or seizures Allergic/Immunologic: Patient denies recent allergic reaction(s) Hematologic/Lymphatic: Patient denies bleeding tendencies Endocrine: Patient denies heat/cold intolerance  GU: As per HPI.  OBJECTIVE There were no vitals filed for this visit. There is no height or weight on file to calculate BMI.  Physical Examination Constitutional: No obvious distress; patient is non-toxic appearing  Cardiovascular: No visible lower extremity edema.  Respiratory: The patient does not have audible wheezing/stridor; respirations do not appear labored  Gastrointestinal: Abdomen non-distended Musculoskeletal: Normal ROM of UEs  Skin: No obvious rashes/open sores  Neurologic: CN 2-12 grossly intact Psychiatric: Answered questions appropriately with normal affect  Hematologic/Lymphatic/Immunologic: No obvious bruises or sites of spontaneous bleeding  Urine microscopy: ***negative *** WBC/hpf, *** RBC/hpf, *** bacteria UA: ***negative *** WBC/hpf, *** RBC/hpf, *** bacteria ***with no evidence of UTI ***with no  evidence of microscopic hematuria ***otherwise unremarkable  PVR: *** ml  ASSESSMENT No diagnosis found.  ***Recurrent UTls / ***History of UTls with insufficient urine culture data to formally validate recurrent UTI diagnosis:  ***  Will plan for follow up in *** months or sooner if needed. Pt verbalized understanding and agreement. All questions were answered.  PLAN Advised the following: ***. Start topical vaginal estrogen cream as prescribed. ***. Maintain adequate fluid intake daily to flush out the urinary tract. ***. Urinate every 4-6 hours while awake to minimize urinary stasis / bacterial overgrowth in the bladder. ***. Consider OTC supplements for UTI prevention. ***. ***No follow-ups on file.  No orders of the defined types were placed in this encounter.   It has been explained that the patient is to follow regularly with their PCP in addition to all other providers involved in their care and to follow instructions provided by these respective offices. Patient advised to contact urology clinic if any urologic-pertaining questions, concerns, new symptoms or problems arise in the interim period.  There are no Patient Instructions on file for this visit.  Electronically signed by:  Lauraine KYM Oz, MSN, FNP-C, CUNP 11/18/2023 1:09 PM

## 2023-11-25 ENCOUNTER — Ambulatory Visit: Payer: MEDICAID | Admitting: Urology

## 2023-11-25 ENCOUNTER — Encounter: Payer: Self-pay | Admitting: Urology

## 2023-11-25 ENCOUNTER — Ambulatory Visit (INDEPENDENT_AMBULATORY_CARE_PROVIDER_SITE_OTHER): Payer: MEDICAID | Admitting: Urology

## 2023-11-25 VITALS — BP 119/77 | HR 78

## 2023-11-25 DIAGNOSIS — R3129 Other microscopic hematuria: Secondary | ICD-10-CM

## 2023-11-25 DIAGNOSIS — Z87440 Personal history of urinary (tract) infections: Secondary | ICD-10-CM

## 2023-11-25 DIAGNOSIS — N3090 Cystitis, unspecified without hematuria: Secondary | ICD-10-CM

## 2023-11-25 DIAGNOSIS — R3915 Urgency of urination: Secondary | ICD-10-CM | POA: Diagnosis not present

## 2023-11-25 DIAGNOSIS — N390 Urinary tract infection, site not specified: Secondary | ICD-10-CM

## 2023-11-25 MED ORDER — CEPHALEXIN 500 MG PO CAPS: 500.0000 mg | ORAL_CAPSULE | Freq: Every day | ORAL | 0 refills | Status: DC | PRN

## 2023-11-25 NOTE — Progress Notes (Signed)
 11/25/2023 3:26 PM   Tiffany Hansen [birth year] [id number]  Referring provider: Jayne Vonn DEL, MD [address] [place],  [place] [address]  No chief complaint on file.   HPI:  New pt -   1) MH, UTI - she has urgency. Going on for several yrs. Abx help.  UA during pregnancy Sep 2024 with rare bacteria and > 50 rbc.  Cx + enterococcus. GC/Chlam negative. F/u urine cx mixed. F/u UA nov 2024 rare bacteria with 0-5 wbc, 0-5 rbc.  Cx + staph epi. No gross hematuria. Voids with a good stream. No frequency and urgency p abx.   UA in April 2024 was clear.  No UTI as a child. No GU history or surgery.  She underwent a KUB in 2012 which was normal and an abdominal ultrasound in 2015 which showed no hydronephrosis.  She works at [employer].   PMH: Past Medical History:  Diagnosis Date   Abdominal pain, recurrent    Anxiety    Constipation, chronic    Depression    History of UTI     Surgical History: Past Surgical History:  Procedure Laterality Date   NO PAST SURGERIES      Home Medications:  Allergies as of 11/25/2023       Reactions   Amoxicillin Itching        Medication List        Accurate as of November 25, 2023  3:26 PM. If you have any questions, ask your nurse or doctor.          acetaminophen  325 MG tablet Commonly known as: Tylenol  Take 2 tablets (650 mg total) by mouth every 4 (four) hours as needed (for pain scale < 4).   ferrous gluconate  324 MG tablet Commonly known as: FERGON Take 1 tablet (324 mg total) by mouth daily with breakfast.   hydrOXYzine  10 MG tablet Commonly known as: ATARAX  Take 1 tablet (10 mg total) by mouth every 8 (eight) hours as needed. For anxiety   ibuprofen  600 MG tablet Commonly known as: ADVIL  Take 1 tablet (600 mg total) by mouth every 6 (six) hours.   nitrofurantoin  (macrocrystal-monohydrate) 100 MG capsule Commonly known as: MACROBID  Take 1 capsule (100 mg total) by mouth 2 (two) times daily. X 7 days    PRENATAL VITAMIN PO Take by mouth.   sertraline  25 MG tablet Commonly known as: Zoloft  Take 1 tablet (25 mg total) by mouth daily.        Allergies:  Allergies  Allergen Reactions   Amoxicillin Itching    Family History: Family History  Problem Relation Age of Onset   Celiac disease Neg Hx    Ulcers Neg Hx    Cholelithiasis Neg Hx     Social History:  reports that she quit smoking about 2 years ago. Her smoking use included cigarettes. She has never used smokeless tobacco. She reports that she does not drink alcohol and does not use drugs.   Physical Exam: There were no vitals taken for this visit.  Constitutional:  Alert and oriented, No acute distress. HEENT: [place] AT, moist mucus membranes.  Trachea midline, no masses. Cardiovascular: No clubbing, cyanosis, or edema. Respiratory: Normal respiratory effort, no increased work of breathing. GI: Abdomen is soft, nontender, nondistended, no abdominal masses GU: No CVA tenderness Skin: No rashes, bruises or suspicious lesions. Neurologic: Grossly intact, no focal deficits, moving all 4 extremities. Psychiatric: Normal mood and affect.  Laboratory Data: Lab Results  Component  Value Date   WBC 9.9 10/18/2023   HGB 9.5 (L) 10/18/2023   HCT 30.8 (L) 10/18/2023   MCV 83.5 10/18/2023   PLT 226 10/18/2023    Lab Results  Component Value Date   CREATININE 0.45 08/01/2023    No results found for: PSA  No results found for: TESTOSTERONE  Lab Results  Component Value Date   HGBA1C 5.0 07/27/2021    Urinalysis    Component Value Date/Time   COLORURINE YELLOW 10/16/2023 2311   APPEARANCEUR HAZY (A) 10/16/2023 2311   LABSPEC 1.017 10/16/2023 2311   PHURINE 6.0 10/16/2023 2311   GLUCOSEU NEGATIVE 10/16/2023 2311   HGBUR NEGATIVE 10/16/2023 2311   BILIRUBINUR NEGATIVE 10/16/2023 2311   KETONESUR NEGATIVE 10/16/2023 2311   PROTEINUR 30 (A) 10/16/2023 2311   UROBILINOGEN 0.2 08/01/2021 0939   NITRITE  NEGATIVE 10/16/2023 2311   LEUKOCYTESUR NEGATIVE 10/16/2023 2311    Lab Results  Component Value Date   BACTERIA RARE (A) 10/16/2023    Pertinent Imaging:  Results for orders placed during the hospital encounter of 03/03/11  DG Abd 1 View  Narrative *RADIOLOGY REPORT*  Clinical Data: Abdominal pain for 3-4 months.  ABDOMEN - 1 VIEW  Comparison: 07/23/2009  Findings: Scattered gas and stool in the colon.  No bowel distension.  No radiopaque stones.  Bones appear intact.  No significant change since the previous study.  IMPRESSION: Normal bowel gas pattern.     Assessment & Plan:    UTI - trial of PC abx. She did well with cephalexin  in the past  - no allergy.    MH - eval with renal US  and cysto/exam  No follow-ups on file.  Donnice Brooks, MD  [facility]  [address] [place], [place] [address] [phone]

## 2023-11-26 LAB — URINALYSIS, ROUTINE W REFLEX MICROSCOPIC
Bilirubin, UA: NEGATIVE
Glucose, UA: NEGATIVE
Ketones, UA: NEGATIVE
Leukocytes,UA: NEGATIVE
Nitrite, UA: NEGATIVE
Protein,UA: NEGATIVE
RBC, UA: NEGATIVE
Specific Gravity, UA: 1.02 (ref 1.005–1.030)
Urobilinogen, Ur: 1 mg/dL (ref 0.2–1.0)
pH, UA: 7.5 (ref 5.0–7.5)

## 2023-11-27 ENCOUNTER — Encounter: Payer: Self-pay | Admitting: Advanced Practice Midwife

## 2023-11-27 ENCOUNTER — Ambulatory Visit: Payer: MEDICAID | Admitting: Advanced Practice Midwife

## 2023-11-27 DIAGNOSIS — F418 Other specified anxiety disorders: Secondary | ICD-10-CM

## 2023-11-27 MED ORDER — NORELGESTROMIN-ETH ESTRADIOL 150-35 MCG/24HR TD PTWK: 1.0000 | MEDICATED_PATCH | TRANSDERMAL | 12 refills | Status: AC

## 2023-11-27 NOTE — Progress Notes (Signed)
 POSTPARTUM VISIT Patient name: Tiffany Hansen MRN [id number]  Date of birth: 2006/06/10 Chief Complaint:   Postpartum Care  History of Present Illness:   Tiffany Hansen is a 18 y.o. [phone] Caucasian female being seen today for a postpartum visit. She is  5.5  weeks postpartum following a spontaneous vaginal delivery at 38.4 gestational weeks. IOL: no. Anesthesia: epidural.  Laceration: none.  Complications: none. Inpatient contraception: no.   Pregnancy complicated by depression/anxiety; hx PTD . Tobacco use: no (former; smokes very occasionally). Substance use disorder: no. Last pap smear: <21yo and results were N/A.  Next pap smear due: June 2026 Patient's last menstrual period was 01/21/2023.  Postpartum course has been uncomplicated. Bleeding none. Bowel function is normal. Bladder function is normal. Urinary incontinence? no, fecal incontinence? no Patient is not sexually active. Last sexual activity: prior to birth of baby. Desired contraception: patches. Patient does not want a pregnancy in the future.  Desired family size is 2 children.   Upstream - 11/27/23 1008       Pregnancy Intention Screening   Does the patient want to become pregnant in the next year? No    Does the patient's partner want to become pregnant in the next year? No    Would the patient like to discuss contraceptive options today? Yes      Contraception Wrap Up   Current Method Abstinence    Contraception Counseling Provided Yes            The pregnancy intention screening data noted above was reviewed. Potential methods of contraception were discussed. The patient elected to proceed with No data recorded.  Edinburgh Postpartum Depression Screening: negative  Edinburgh Postnatal Depression Scale - 11/27/23 1003       Edinburgh Postnatal Depression Scale:  In the Past 7 Days   I have been able to laugh and see the funny side of things. 0    I have looked forward with enjoyment to things. 0    I have  blamed myself unnecessarily when things went wrong. 0    I have been anxious or worried for no good reason. 0    I have felt scared or panicky for no good reason. 0    Things have been getting on top of me. 0    I have been so unhappy that I have had difficulty sleeping. 0    I have felt sad or miserable. 0    I have been so unhappy that I have been crying. 0    The thought of harming myself has occurred to me. 0    Edinburgh Postnatal Depression Scale Total 0                04/22/2023   10:11 AM 03/06/2023   12:06 PM 08/02/2022    2:31 PM 03/01/2022   11:02 AM  GAD 7 : Generalized Anxiety Score  Nervous, Anxious, on Edge 1 3 0 0  Control/stop worrying 0 3 0 0  Worry too much - different things 0 3 0 0  Trouble relaxing 0 1 0 0  Restless 0 0 0 0  Easily annoyed or irritable 0 0 0 0  Afraid - awful might happen 0 1 0 0  Total GAD 7 Score 1 11 0 0     Baby's course has been uncomplicated. Baby is feeding by bottle. Infant has a pediatrician/family doctor? Yes.  Childcare strategy if returning to work/school: family.  Pt has material needs met  for her and baby: Yes.   Review of Systems:   Pertinent items are noted in HPI Denies Abnormal vaginal discharge w/ itching/odor/irritation, headaches, visual changes, shortness of breath, chest pain, abdominal pain, severe nausea/vomiting, or problems with urination or bowel movements. Pertinent History Reviewed:  Reviewed past medical,surgical, obstetrical and family history.  Reviewed problem list, medications and allergies. OB History  Gravida Para Term Preterm AB Living  3 2 1 1 1 2   SAB IAB Ectopic Multiple Live Births  1 0 0 0 2    # Outcome Date GA Lbr Len/2nd Weight Sex Type Anes PTL Lv  3 Term 10/18/23 [redacted]w[redacted]d 13:46 / 00:17 7 lb 14.3 oz (3.58 kg) M Vag-Spont EPI  LIV  2 Preterm 12/12/21 [redacted]w[redacted]d 07:59 / 00:36 6 lb 0.3 oz (2.73 kg) M Vag-Spont EPI  LIV     Complications: Intraamniotic Infection  1 SAB            Physical  Assessment:   Vitals:   11/27/23 1006  BP: 124/71  Pulse: 77  Weight: 135 lb 12.8 oz (61.6 kg)  Height: 5' 2 (1.575 m)  Body mass index is 24.84 kg/m.       Physical Examination:   General appearance: alert, well appearing, and in no distress  Mental status: alert, oriented to person, place, and time  Skin: warm & dry   Cardiovascular: normal heart rate noted   Respiratory: normal respiratory effort, no distress   Breasts: deferred, no complaints   Abdomen: soft, non-tender   Pelvic: examination not indicated. Thin prep pap obtained: No  Rectal: not examined  Extremities: Edema: none         No results found for this or any previous visit (from the past 24 hours).  Assessment & Plan:  1) Postpartum exam 2) 5.5 wks s/p spontaneous vaginal delivery 3) bottle feeding 4) Depression screening: neg without meds 5) Contraception management: doesn't want more kids but not interested in Depo or LARC; rx Xulane (smokes very occasionally); to start ASAP and use back-up x first month 6) Chronic urinary urgency: seeing urology and has cystogram next week (Dr Nieves)  Essential components of care per ACOG recommendations:  1.  Mood and well being:  If positive depression screen, discussed and plan developed.  If using tobacco we discussed reduction/cessation and risk of relapse If current substance abuse, we discussed and referral to local resources was offered.   2. Infant care and feeding:  If breastfeeding, discussed returning to work, pumping, breastfeeding-associated pain, guidance regarding return to fertility while lactating if not using another method. If needed, patient was provided with a letter to be allowed to pump q 2-3hrs to support lactation in a private location with access to a refrigerator to store breastmilk.   Recommended that all caregivers be immunized for flu, pertussis and other preventable communicable diseases If pt does not have material needs met for  her/baby, referred to local resources for help obtaining these.  3. Sexuality, contraception and birth spacing Provided guidance regarding sexuality, management of dyspareunia, and resumption of intercourse Discussed avoiding interpregnancy interval <18mths and recommended birth spacing of 18 months  4. Sleep and fatigue Discussed coping options for fatigue and sleep disruption Encouraged family/partner/community support of 4 hrs of uninterrupted sleep to help with mood and fatigue  5. Physical recovery  If pt had a C/S, assessed incisional pain and providing guidance on normal vs prolonged recovery If pt had a laceration, perineal healing and pain reviewed.  If  urinary or fecal incontinence, discussed management and referred to PT or uro/gyn if indicated  Patient is safe to resume physical activity. Discussed attainment of healthy weight.  6.  Chronic disease management Discussed pregnancy complications if any, and their implications for future childbearing and long-term maternal health. Review recommendations for prevention of recurrent pregnancy complications, such as 17 hydroxyprogesterone caproate to reduce risk for recurrent PTB not applicable, or aspirin to reduce risk of preeclampsia not applicable. Pt had GDM: no. If yes, 2hr GTT scheduled: not applicable. Reviewed medications and non-pregnant dosing including consideration of whether pt is breastfeeding using a reliable resource such as LactMed: not applicable Referred for f/u w/ PCP or subspecialist providers as indicated: not applicable  7. Health maintenance Mammogram at 20yo or earlier if indicated Pap smears as indicated  Meds:  Meds ordered this encounter  Medications   norelgestromin -ethinyl estradiol  (XULANE) 150-35 MCG/24HR transdermal patch    Sig: Place 1 patch onto the skin once a week.    Dispense:  3 patch    Refill:  12    Supervising Provider:   MARILYNN NEST [[id number]]    Follow-up: Return for 3 month  contraception f/u.   No orders of the defined types were placed in this encounter.   Suzen JONETTA Gentry CNM 11/27/2023 12:43 PM

## 2023-12-02 ENCOUNTER — Encounter: Payer: Self-pay | Admitting: Advanced Practice Midwife

## 2023-12-02 ENCOUNTER — Ambulatory Visit (HOSPITAL_COMMUNITY)
Admission: RE | Admit: 2023-12-02 | Discharge: 2023-12-02 | Disposition: A | Discharge status: Home / Self Care | Payer: MEDICAID | Source: Ambulatory Visit | Attending: Urology | Admitting: Urology

## 2023-12-02 DIAGNOSIS — R3129 Other microscopic hematuria: Secondary | ICD-10-CM | POA: Insufficient documentation

## 2023-12-04 ENCOUNTER — Encounter: Payer: Self-pay | Admitting: Advanced Practice Midwife

## 2023-12-13 ENCOUNTER — Encounter: Payer: Self-pay | Admitting: Advanced Practice Midwife

## 2024-01-06 ENCOUNTER — Other Ambulatory Visit: Payer: MEDICAID | Admitting: Urology

## 2024-05-13 ENCOUNTER — Ambulatory Visit: Payer: MEDICAID

## 2024-05-13 ENCOUNTER — Encounter: Payer: Self-pay | Admitting: Advanced Practice Midwife

## 2024-05-13 VITALS — BP 108/59 | Ht 62.0 in | Wt 117.0 lb

## 2024-05-13 DIAGNOSIS — Z3201 Encounter for pregnancy test, result positive: Secondary | ICD-10-CM

## 2024-05-13 DIAGNOSIS — Z3202 Encounter for pregnancy test, result negative: Secondary | ICD-10-CM | POA: Diagnosis not present

## 2024-05-13 LAB — POCT URINE PREGNANCY: Preg Test, Ur: NEGATIVE

## 2024-05-13 NOTE — Progress Notes (Signed)
   NURSE VISIT- PREGNANCY CONFIRMATION   SUBJECTIVE:  Tiffany Hansen is a 18 y.o. [phone] female at Unknown by certain LMP of Patient's last menstrual period was 04/11/2024 (exact date). Here for pregnancy confirmation.  Home pregnancy test: positive x 6 with only half resulting in positive pregnancy tests. She reports no complaints.  She is not taking prenatal vitamins.    OBJECTIVE:  BP (!) 108/59 (BP Location: Right Arm, Patient Position: Sitting, Cuff Size: Normal)   Ht 5' 2 (1.575 m)   Wt 117 lb (53.1 kg)   LMP 04/11/2024 (Exact Date)   Breastfeeding No   BMI 21.40 kg/m   Appears well, in no apparent distress  No results found for this or any previous visit (from the past 24 hours).  ASSESSMENT: Negative pregnancy test in office Patient sent to have hCG level drawn.  PLAN: Follow-up based off of hCG quantitative results come back and proceed from there.   Aleck FORBES Blase  05/13/2024 2:29 PM

## 2024-05-14 ENCOUNTER — Ambulatory Visit: Payer: Self-pay | Admitting: Adult Health

## 2024-05-14 LAB — BETA HCG QUANT (REF LAB): hCG Quant: <1 m[IU]/mL

## 2024-08-14 ENCOUNTER — Encounter: Payer: Self-pay | Admitting: Advanced Practice Midwife
